# Patient Record
Sex: Female | Born: 1935 | Race: White | Hispanic: No | Marital: Married | Smoking: Former smoker
Health system: Southern US, Community
[De-identification: ages and names within clinical notes are randomized; demographics above are authoritative.]

## PROBLEM LIST (undated history)

## (undated) DIAGNOSIS — I4891 Unspecified atrial fibrillation: Secondary | ICD-10-CM

## (undated) DIAGNOSIS — R32 Unspecified urinary incontinence: Secondary | ICD-10-CM

## (undated) DIAGNOSIS — N200 Calculus of kidney: Secondary | ICD-10-CM

## (undated) DIAGNOSIS — I2699 Other pulmonary embolism without acute cor pulmonale: Secondary | ICD-10-CM

## (undated) DIAGNOSIS — I82409 Acute embolism and thrombosis of unspecified deep veins of unspecified lower extremity: Secondary | ICD-10-CM

## (undated) DIAGNOSIS — G629 Polyneuropathy, unspecified: Secondary | ICD-10-CM

## (undated) DIAGNOSIS — N390 Urinary tract infection, site not specified: Secondary | ICD-10-CM

## (undated) DIAGNOSIS — T7840XA Allergy, unspecified, initial encounter: Secondary | ICD-10-CM

## (undated) DIAGNOSIS — Z86718 Personal history of other venous thrombosis and embolism: Secondary | ICD-10-CM

## (undated) DIAGNOSIS — E119 Type 2 diabetes mellitus without complications: Secondary | ICD-10-CM

## (undated) DIAGNOSIS — M1990 Unspecified osteoarthritis, unspecified site: Secondary | ICD-10-CM

## (undated) DIAGNOSIS — R3129 Other microscopic hematuria: Secondary | ICD-10-CM

## (undated) DIAGNOSIS — M810 Age-related osteoporosis without current pathological fracture: Secondary | ICD-10-CM

## (undated) DIAGNOSIS — IMO0002 Reserved for concepts with insufficient information to code with codable children: Secondary | ICD-10-CM

## (undated) DIAGNOSIS — N6019 Diffuse cystic mastopathy of unspecified breast: Secondary | ICD-10-CM

## (undated) DIAGNOSIS — N952 Postmenopausal atrophic vaginitis: Secondary | ICD-10-CM

## (undated) DIAGNOSIS — E785 Hyperlipidemia, unspecified: Secondary | ICD-10-CM

## (undated) DIAGNOSIS — N3020 Other chronic cystitis without hematuria: Secondary | ICD-10-CM

## (undated) DIAGNOSIS — I10 Essential (primary) hypertension: Secondary | ICD-10-CM

## (undated) HISTORY — DX: Urinary tract infection, site not specified: N39.0

## (undated) HISTORY — DX: Unspecified atrial fibrillation: I48.91

## (undated) HISTORY — PX: OTHER SURGICAL HISTORY: SHX169

## (undated) HISTORY — DX: Unspecified osteoarthritis, unspecified site: M19.90

## (undated) HISTORY — DX: Unspecified urinary incontinence: R32

## (undated) HISTORY — PX: KNEE ARTHROSCOPY: SHX127

## (undated) HISTORY — DX: Polyneuropathy, unspecified: G62.9

## (undated) HISTORY — DX: Other chronic cystitis without hematuria: N30.20

## (undated) HISTORY — DX: Diffuse cystic mastopathy of unspecified breast: N60.19

## (undated) HISTORY — DX: Reserved for concepts with insufficient information to code with codable children: IMO0002

## (undated) HISTORY — DX: Age-related osteoporosis without current pathological fracture: M81.0

## (undated) HISTORY — DX: Essential (primary) hypertension: I10

## (undated) HISTORY — PX: SPLENECTOMY, TOTAL: SHX788

## (undated) HISTORY — PX: ABDOMINAL HYSTERECTOMY: SHX81

## (undated) HISTORY — DX: Allergy, unspecified, initial encounter: T78.40XA

## (undated) HISTORY — DX: Hyperlipidemia, unspecified: E78.5

## (undated) HISTORY — DX: Other pulmonary embolism without acute cor pulmonale: I26.99

## (undated) HISTORY — DX: Other microscopic hematuria: R31.29

## (undated) HISTORY — DX: Calculus of kidney: N20.0

## (undated) HISTORY — PX: HERNIA REPAIR: SHX51

## (undated) HISTORY — DX: Type 2 diabetes mellitus without complications: E11.9

## (undated) HISTORY — DX: Postmenopausal atrophic vaginitis: N95.2

## (undated) HISTORY — PX: BREAST SURGERY: SHX581

## (undated) HISTORY — PX: TONSILLECTOMY: SUR1361

## (undated) HISTORY — DX: Personal history of other venous thrombosis and embolism: Z86.718

## (undated) HISTORY — DX: Acute embolism and thrombosis of unspecified deep veins of unspecified lower extremity: I82.409

---

## 1990-04-02 DIAGNOSIS — I10 Essential (primary) hypertension: Secondary | ICD-10-CM

## 1990-04-02 HISTORY — DX: Essential (primary) hypertension: I10

## 2004-06-01 ENCOUNTER — Ambulatory Visit: Payer: Self-pay | Admitting: Internal Medicine

## 2005-07-04 ENCOUNTER — Ambulatory Visit: Payer: Self-pay | Admitting: Internal Medicine

## 2005-10-18 ENCOUNTER — Ambulatory Visit: Payer: Self-pay

## 2006-01-01 ENCOUNTER — Ambulatory Visit: Payer: Self-pay | Admitting: Anesthesiology

## 2006-02-04 ENCOUNTER — Ambulatory Visit: Payer: Self-pay | Admitting: Anesthesiology

## 2006-03-06 ENCOUNTER — Ambulatory Visit: Payer: Self-pay | Admitting: Anesthesiology

## 2006-04-02 DIAGNOSIS — I2699 Other pulmonary embolism without acute cor pulmonale: Secondary | ICD-10-CM

## 2006-04-02 HISTORY — DX: Other pulmonary embolism without acute cor pulmonale: I26.99

## 2006-05-23 ENCOUNTER — Ambulatory Visit: Payer: Self-pay | Admitting: Anesthesiology

## 2006-07-13 ENCOUNTER — Inpatient Hospital Stay: Payer: Self-pay | Admitting: Internal Medicine

## 2006-07-13 ENCOUNTER — Other Ambulatory Visit: Payer: Self-pay

## 2006-08-07 ENCOUNTER — Emergency Department: Payer: Self-pay | Admitting: Emergency Medicine

## 2006-09-19 ENCOUNTER — Ambulatory Visit: Payer: Self-pay | Admitting: Internal Medicine

## 2006-10-17 ENCOUNTER — Ambulatory Visit: Payer: Self-pay | Admitting: Urology

## 2006-10-30 ENCOUNTER — Ambulatory Visit: Payer: Self-pay | Admitting: Urology

## 2007-04-21 ENCOUNTER — Ambulatory Visit: Payer: Self-pay | Admitting: Urology

## 2007-09-23 ENCOUNTER — Ambulatory Visit: Payer: Self-pay | Admitting: Internal Medicine

## 2008-04-27 ENCOUNTER — Ambulatory Visit: Payer: Self-pay | Admitting: Urology

## 2008-07-10 ENCOUNTER — Ambulatory Visit: Payer: Self-pay | Admitting: Internal Medicine

## 2008-09-27 ENCOUNTER — Ambulatory Visit: Payer: Self-pay | Admitting: Internal Medicine

## 2009-02-14 ENCOUNTER — Ambulatory Visit: Payer: Self-pay

## 2009-04-02 HISTORY — PX: COLONOSCOPY: SHX174

## 2009-04-02 HISTORY — PX: POLYPECTOMY: SHX149

## 2009-04-21 ENCOUNTER — Ambulatory Visit: Payer: Self-pay | Admitting: Urology

## 2009-04-23 ENCOUNTER — Emergency Department: Payer: Self-pay | Admitting: Emergency Medicine

## 2009-05-03 ENCOUNTER — Ambulatory Visit: Payer: Self-pay | Admitting: Urology

## 2009-07-19 ENCOUNTER — Ambulatory Visit: Payer: Self-pay | Admitting: Internal Medicine

## 2009-07-29 ENCOUNTER — Inpatient Hospital Stay: Payer: Self-pay | Admitting: Internal Medicine

## 2009-08-02 ENCOUNTER — Encounter: Payer: Self-pay | Admitting: Internal Medicine

## 2009-08-31 ENCOUNTER — Encounter: Payer: Self-pay | Admitting: Internal Medicine

## 2009-09-30 ENCOUNTER — Encounter: Payer: Self-pay | Admitting: Internal Medicine

## 2009-12-19 ENCOUNTER — Ambulatory Visit: Payer: Self-pay | Admitting: General Practice

## 2009-12-30 ENCOUNTER — Ambulatory Visit: Payer: Self-pay | Admitting: Cardiovascular Disease

## 2010-01-02 ENCOUNTER — Ambulatory Visit: Payer: Self-pay | Admitting: Vascular Surgery

## 2010-01-04 ENCOUNTER — Inpatient Hospital Stay: Payer: Self-pay | Admitting: General Practice

## 2010-07-19 ENCOUNTER — Other Ambulatory Visit: Payer: Self-pay | Admitting: Internal Medicine

## 2010-09-06 ENCOUNTER — Ambulatory Visit: Payer: Self-pay | Admitting: Vascular Surgery

## 2010-11-22 ENCOUNTER — Ambulatory Visit: Payer: Self-pay | Admitting: Urology

## 2011-03-03 DIAGNOSIS — I82409 Acute embolism and thrombosis of unspecified deep veins of unspecified lower extremity: Secondary | ICD-10-CM

## 2011-03-03 HISTORY — DX: Acute embolism and thrombosis of unspecified deep veins of unspecified lower extremity: I82.409

## 2011-03-14 ENCOUNTER — Inpatient Hospital Stay: Payer: Self-pay | Admitting: Vascular Surgery

## 2012-08-20 ENCOUNTER — Inpatient Hospital Stay: Payer: Self-pay | Admitting: General Surgery

## 2012-08-20 DIAGNOSIS — K430 Incisional hernia with obstruction, without gangrene: Secondary | ICD-10-CM

## 2012-08-20 LAB — COMPREHENSIVE METABOLIC PANEL
Alkaline Phosphatase: 83 U/L (ref 50–136)
BUN: 21 mg/dL — ABNORMAL HIGH (ref 7–18)
Bilirubin,Total: 0.5 mg/dL (ref 0.2–1.0)
Chloride: 101 mmol/L (ref 98–107)
Creatinine: 1.37 mg/dL — ABNORMAL HIGH (ref 0.60–1.30)
Osmolality: 286 (ref 275–301)
Sodium: 138 mmol/L (ref 136–145)
Total Protein: 8.5 g/dL — ABNORMAL HIGH (ref 6.4–8.2)

## 2012-08-20 LAB — CBC
HCT: 42.9 % (ref 35.0–47.0)
HGB: 14.6 g/dL (ref 12.0–16.0)
MCH: 33 pg (ref 26.0–34.0)
MCHC: 33.9 g/dL (ref 32.0–36.0)
Platelet: 314 10*3/uL (ref 150–440)
RDW: 13.8 % (ref 11.5–14.5)
WBC: 9.7 10*3/uL (ref 3.6–11.0)

## 2012-08-20 LAB — MAGNESIUM: Magnesium: 2.1 mg/dL

## 2012-08-20 LAB — URINALYSIS, COMPLETE
Bacteria: NONE SEEN
Leukocyte Esterase: NEGATIVE
Nitrite: NEGATIVE
Squamous Epithelial: 1

## 2012-08-20 LAB — PROTIME-INR
INR: 2.4
Prothrombin Time: 25.4 secs — ABNORMAL HIGH (ref 11.5–14.7)

## 2012-08-20 LAB — TROPONIN I: Troponin-I: <0.02 ng/mL

## 2012-08-21 ENCOUNTER — Encounter: Payer: Self-pay | Admitting: General Surgery

## 2012-08-21 LAB — CBC WITH DIFFERENTIAL/PLATELET
Basophil #: 0.1 10*3/uL (ref 0.0–0.1)
Basophil %: 0.8 %
Eosinophil #: 0.1 10*3/uL (ref 0.0–0.7)
Eosinophil %: 2.3 %
HGB: 12.9 g/dL (ref 12.0–16.0)
Lymphocyte #: 1.9 10*3/uL (ref 1.0–3.6)
MCH: 32.9 pg (ref 26.0–34.0)
MCHC: 33.5 g/dL (ref 32.0–36.0)
MCV: 98 fL (ref 80–100)
Monocyte #: 0.6 x10 3/mm (ref 0.2–0.9)
Neutrophil %: 56.4 %
WBC: 6.3 10*3/uL (ref 3.6–11.0)

## 2012-08-21 LAB — BASIC METABOLIC PANEL
BUN: 15 mg/dL (ref 7–18)
Calcium, Total: 8.6 mg/dL (ref 8.5–10.1)
Chloride: 111 mmol/L — ABNORMAL HIGH (ref 98–107)
Co2: 32 mmol/L (ref 21–32)
Creatinine: 0.98 mg/dL (ref 0.60–1.30)
Potassium: 5.2 mmol/L — ABNORMAL HIGH (ref 3.5–5.1)
Sodium: 145 mmol/L (ref 136–145)

## 2012-08-21 LAB — PROTIME-INR
INR: 2
Prothrombin Time: 22.3 secs — ABNORMAL HIGH (ref 11.5–14.7)

## 2012-08-22 LAB — BASIC METABOLIC PANEL
Anion Gap: 4 — ABNORMAL LOW (ref 7–16)
BUN: 12 mg/dL (ref 7–18)
Calcium, Total: 9.1 mg/dL (ref 8.5–10.1)
Chloride: 111 mmol/L — ABNORMAL HIGH (ref 98–107)
Creatinine: 0.87 mg/dL (ref 0.60–1.30)
EGFR (African American): >60
Osmolality: 279 (ref 275–301)
Potassium: 5.1 mmol/L (ref 3.5–5.1)

## 2012-08-22 LAB — PROTIME-INR
INR: 1.5
Prothrombin Time: 17.8 secs — ABNORMAL HIGH (ref 11.5–14.7)

## 2012-08-23 LAB — BASIC METABOLIC PANEL
Anion Gap: 4 — ABNORMAL LOW (ref 7–16)
Calcium, Total: 9.3 mg/dL (ref 8.5–10.1)
Chloride: 107 mmol/L (ref 98–107)
EGFR (African American): >60
EGFR (Non-African Amer.): 53 — ABNORMAL LOW
Glucose: 94 mg/dL (ref 65–99)
Osmolality: 278 (ref 275–301)
Potassium: 4.4 mmol/L (ref 3.5–5.1)

## 2012-08-23 LAB — PROTIME-INR
INR: 1.2
Prothrombin Time: 15.5 secs — ABNORMAL HIGH (ref 11.5–14.7)

## 2012-08-24 LAB — PROTIME-INR: INR: 1.1

## 2012-08-25 LAB — PLATELET COUNT: Platelet: 299 10*3/uL (ref 150–440)

## 2012-08-25 LAB — PROTIME-INR: Prothrombin Time: 13.8 secs (ref 11.5–14.7)

## 2012-08-26 LAB — CREATININE, SERUM
Creatinine: 0.94 mg/dL (ref 0.60–1.30)
EGFR (African American): >60
EGFR (Non-African Amer.): 54 — ABNORMAL LOW

## 2012-08-27 ENCOUNTER — Encounter: Payer: Self-pay | Admitting: General Surgery

## 2012-08-28 ENCOUNTER — Ambulatory Visit (INDEPENDENT_AMBULATORY_CARE_PROVIDER_SITE_OTHER): Payer: Medicare Other | Admitting: General Surgery

## 2012-08-28 ENCOUNTER — Other Ambulatory Visit: Payer: Self-pay | Admitting: General Surgery

## 2012-08-28 ENCOUNTER — Encounter: Payer: Self-pay | Admitting: General Surgery

## 2012-08-28 VITALS — BP 120/78 | HR 78 | Resp 16 | Ht 61.0 in | Wt 150.0 lb

## 2012-08-28 DIAGNOSIS — K432 Incisional hernia without obstruction or gangrene: Secondary | ICD-10-CM

## 2012-08-28 NOTE — Progress Notes (Signed)
Patient ID: Crystal Goodwin, female   DOB: 01/20/1922, 79 y.o.   MRN: [id number]  Chief Complaint  Patient presents with  . Pre-op Exam    hernia repair    HPI Crystal Goodwin is a 79 y.o. female Patient here today for preop incisional hernia surgery. About 10 days ago the patient presented to the hospital with complained of significant abdominal pain nausea and vomiting. A CT scan showed evidence of low midline abdominal wall hernia with a transition zone in the small bowel suggesting partial obstruction. Patient was admitted and kept n.p.o. for 48 hours and showed good response with a return of bowel activity and full resolution of her pain nausea and vomiting. She is currently on a semisolid diet and appears to be doing well tentative plan for repair of this hernia and laparoscopic or open as deemed feasible at time of surgery. Surgery is scheduled for next week. HPI  Past Medical History  Diagnosis Date  . Diabetes mellitus without complication   . Neuropathy   . Atrial fibrillation   . Chronic UTI   . Urine incontinence   . H/O blood clots   . Arthritis     Past Surgical History  Procedure Laterality Date  . Abdominal hysterectomy    . Splenectomy, total    . Breast surgery      biopsy    History reviewed. No pertinent family history.  Social History History  Substance Use Topics  . Smoking status: Not on file  . Smokeless tobacco: Not on file  . Alcohol Use: Not on file    Allergies  Allergen Reactions  . Macrodantin (Nitrofurantoin Macrocrystal)   . Penicillins   . Sulfa Antibiotics Other (See Comments)    Per patient "skin breaks out"    Current Outpatient Prescriptions  Medication Sig Dispense Refill  . calcium citrate-vitamin D (CITRACAL+D) 315-200 MG-UNIT per tablet Take 1 tablet by mouth daily.      Marland Kitchen diltiazem (DILACOR XR) 180 MG 24 hr capsule Take 180 mg by mouth daily.      Marland Kitchen enoxaparin (LOVENOX) 80 MG/0.8ML injection Inject 70 mg into the  skin daily.      Marland Kitchen gabapentin (NEURONTIN) 800 MG tablet Take 800 mg by mouth daily.      Marland Kitchen glimepiride (AMARYL) 2 MG tablet Take 2 mg by mouth daily before breakfast.      . hydrochlorothiazide (HYDRODIURIL) 25 MG tablet Take 25 mg by mouth daily.      . Multiple Vitamin (MULTIVITAMIN) tablet Take 1 tablet by mouth daily.      . Vilazodone HCl (VIIBRYD) 40 MG TABS Take 40 mg by mouth daily.       No current facility-administered medications for this visit.    Review of Systems Review of Systems  Constitutional: Negative.   Respiratory: Negative.   Cardiovascular: Negative.     Blood pressure 120/78, pulse 78, resp. rate 16, height 5\' 1"  (1.549 m), weight 150 lb (68.04 kg).  Physical Exam Physical Exam  Constitutional: She is oriented to person, place, and time. She appears well-developed and well-nourished.  Cardiovascular: Normal rate and regular rhythm.   Pulmonary/Chest: Effort normal.  Abdominal: Soft. Bowel sounds are normal. She exhibits distension. There is no tenderness.  No definite hernia is palpable but CT shows this to be in the suprapubic area.  Neurological: She is alert and oriented to person, place, and time.  Skin: Skin is warm.  mild distention of abdomen  Data Reviewed CT  scan reviewed.  Assessment    Incisional hernia with bowel involvement.      Plan    Repair discussed fully with pt and her daughter.        Merelin Human G 08/28/2012, 10:49 AM

## 2012-08-28 NOTE — Patient Instructions (Addendum)
Resume activities as tolerated. The patient is aware to call back for any questions or concerns. Half dose Lovenox on Sunday and none on Monday

## 2012-09-01 ENCOUNTER — Ambulatory Visit: Payer: Self-pay | Admitting: General Surgery

## 2012-09-01 DIAGNOSIS — K432 Incisional hernia without obstruction or gangrene: Secondary | ICD-10-CM

## 2012-09-02 ENCOUNTER — Encounter: Payer: Self-pay | Admitting: *Deleted

## 2012-09-02 ENCOUNTER — Encounter: Payer: Self-pay | Admitting: General Surgery

## 2012-09-02 LAB — CBC WITH DIFFERENTIAL/PLATELET
Basophil %: 0.7 %
HCT: 38.9 % (ref 35.0–47.0)
Lymphocyte #: 1.1 10*3/uL (ref 1.0–3.6)
Lymphocyte %: 13.9 %
MCH: 32.9 pg (ref 26.0–34.0)
Monocyte #: 0.6 x10 3/mm (ref 0.2–0.9)
Monocyte %: 7 %
Neutrophil #: 6.1 10*3/uL (ref 1.4–6.5)
WBC: 8 10*3/uL (ref 3.6–11.0)

## 2012-09-02 LAB — BASIC METABOLIC PANEL
Anion Gap: 4 — ABNORMAL LOW (ref 7–16)
Chloride: 108 mmol/L — ABNORMAL HIGH (ref 98–107)
Co2: 29 mmol/L (ref 21–32)
Creatinine: 1.15 mg/dL (ref 0.60–1.30)
EGFR (African American): 49 — ABNORMAL LOW
EGFR (Non-African Amer.): 42 — ABNORMAL LOW
Osmolality: 281 (ref 275–301)
Sodium: 141 mmol/L (ref 136–145)

## 2012-09-02 NOTE — Telephone Encounter (Signed)
This encounter was created in error - please disregard.

## 2012-09-05 ENCOUNTER — Encounter: Payer: Self-pay | Admitting: General Surgery

## 2012-09-05 ENCOUNTER — Ambulatory Visit (INDEPENDENT_AMBULATORY_CARE_PROVIDER_SITE_OTHER): Payer: Medicare Other | Admitting: General Surgery

## 2012-09-05 VITALS — BP 120/64 | HR 68 | Resp 14 | Ht 61.0 in | Wt 153.0 lb

## 2012-09-05 DIAGNOSIS — K432 Incisional hernia without obstruction or gangrene: Secondary | ICD-10-CM

## 2012-09-05 NOTE — Progress Notes (Signed)
Patient ID: Shakia Sebastiano, female   DOB: [birth year], 79 y.o.   MRN: [id number]  No chief complaint on file.   HPI Raychell Holcomb is a 79 y.o. female here today for bleeding from incision of incisional  hernia repair done on 09/02/12. She went to the bathroom and noted bloody drainage from her incision. Otherwise doing well. HPI  Past Medical History  Diagnosis Date  . Diabetes mellitus without complication   . Neuropathy   . Atrial fibrillation   . Chronic UTI   . Urine incontinence   . H/O blood clots   . Arthritis   . Allergy   . Hypertension 1992    Past Surgical History  Procedure Laterality Date  . Abdominal hysterectomy    . Splenectomy, total    . Breast surgery      biopsy  . Colonoscopy  2011  . Polypectomy  2011  . Tonsillectomy      History reviewed. No pertinent family history.  Social History History  Substance Use Topics  . Smoking status: Former Smoker -- 1.00 packs/day for 10 years  . Smokeless tobacco: Never Used  . Alcohol Use: Yes    Allergies  Allergen Reactions  . Macrodantin (Nitrofurantoin Macrocrystal)   . Penicillins   . Sulfa Antibiotics Other (See Comments)    Per patient "skin breaks out"    Current Outpatient Prescriptions  Medication Sig Dispense Refill  . ALPRAZolam (XANAX) 0.5 MG tablet Take 0.5 mg by mouth daily.      . calcium citrate-vitamin D (CITRACAL+D) 315-200 MG-UNIT per tablet Take 1 tablet by mouth daily.      Marland Kitchen diltiazem (DILACOR XR) 180 MG 24 hr capsule Take 180 mg by mouth daily.      Marland Kitchen enoxaparin (LOVENOX) 80 MG/0.8ML injection Inject 70 mg into the skin daily.      Marland Kitchen gabapentin (NEURONTIN) 800 MG tablet Take 800 mg by mouth daily.      Marland Kitchen glimepiride (AMARYL) 2 MG tablet Take 2 mg by mouth daily before breakfast.      . hydrochlorothiazide (HYDRODIURIL) 25 MG tablet Take 25 mg by mouth daily.      . Multiple Vitamin (MULTIVITAMIN) tablet Take 1 tablet by mouth daily.      . Vilazodone HCl (VIIBRYD) 40 MG  TABS Take 40 mg by mouth daily.       No current facility-administered medications for this visit.    Review of Systems Review of Systems  Constitutional: Negative.   Respiratory: Negative.   Cardiovascular: Negative.     Blood pressure 120/64, pulse 68, resp. rate 14, height 5\' 1"  (1.549 m), weight 153 lb (69.4 kg).  Physical Exam Physical ExamIncision in lower mid abdomen is intact. Minimal spotting of old blood/serroma fluid noted. No swelling or redness associated. Abd is otherwise soft, nontender. Data Reviewed    Assessment    Likely seroma that drained.     Plan    Dry dressing as needed. RTC on 09/08/12 as scheduled.        SANKAR,SEEPLAPUTHUR G 09/05/2012, 4:44 PM

## 2012-09-05 NOTE — Patient Instructions (Addendum)
Return on Monday  

## 2012-09-08 ENCOUNTER — Encounter: Payer: Self-pay | Admitting: General Surgery

## 2012-09-08 ENCOUNTER — Ambulatory Visit (INDEPENDENT_AMBULATORY_CARE_PROVIDER_SITE_OTHER): Payer: Medicare Other | Admitting: General Surgery

## 2012-09-08 VITALS — BP 126/70 | HR 76 | Resp 16 | Ht 61.0 in | Wt 153.0 lb

## 2012-09-08 DIAGNOSIS — Z7901 Long term (current) use of anticoagulants: Secondary | ICD-10-CM

## 2012-09-08 DIAGNOSIS — Z5181 Encounter for therapeutic drug level monitoring: Secondary | ICD-10-CM

## 2012-09-08 DIAGNOSIS — K432 Incisional hernia without obstruction or gangrene: Secondary | ICD-10-CM

## 2012-09-08 NOTE — Patient Instructions (Addendum)
Patient to return in four weeks.  Patient to have labs drawn today.

## 2012-09-08 NOTE — Progress Notes (Signed)
Patient ID: Crystal Goodwin, female   DOB: [birth year], 79 y.o.   MRN: [id number] This is a 79 year old female here for her post op hernia repair done on 09/01/12. No complaints. No drainage noted since last week Friday Incision is clean  Removed staples applied stir stirps. F/U in 4 weeks.  Patient to have the following drawn at [facility] lab today: protime.

## 2012-09-09 ENCOUNTER — Telehealth: Payer: Self-pay | Admitting: *Deleted

## 2012-09-09 LAB — PROTIME-INR
INR: 1.2 (ref 0.8–1.2)
Prothrombin Time: 12.7 s — ABNORMAL HIGH (ref 9.1–12.0)

## 2012-09-09 NOTE — Telephone Encounter (Signed)
Message copied by Nicholes Mango on Tue Sep 09, 2012  8:59 AM ------      Message from: Kieth Brightly      Created: Tue Sep 09, 2012  8:25 AM       Pt needs to check with Dr. Jasper Loser office for further coumadin dosing and Protimes. ------

## 2012-09-09 NOTE — Telephone Encounter (Signed)
Robin at Dr. Jasper Loser office was contacted as instructed. She will forward the message to Dr. Jasper Loser nurse and they will contact the patient for further follow up. Lab results faxed as well.

## 2012-10-06 ENCOUNTER — Ambulatory Visit (INDEPENDENT_AMBULATORY_CARE_PROVIDER_SITE_OTHER): Payer: Medicare Other | Admitting: General Surgery

## 2012-10-06 ENCOUNTER — Encounter: Payer: Self-pay | Admitting: General Surgery

## 2012-10-06 VITALS — BP 130/72 | HR 68 | Resp 13 | Ht 61.0 in | Wt 151.0 lb

## 2012-10-06 DIAGNOSIS — K432 Incisional hernia without obstruction or gangrene: Secondary | ICD-10-CM

## 2012-10-06 NOTE — Progress Notes (Signed)
Patient ID: Crystal Goodwin, female   DOB: [birth year], 80 y.o.   MRN: [id number] Patient here for 6 week follow up for incisional hernia repair. Patient last here 09-08-12. Patient reports no discomfort and has resumed normal daily activities.   Exam shows abdominal incision lower part to be well healed. No hernia detected. Abdomen is soft and non tender.

## 2012-10-06 NOTE — Patient Instructions (Addendum)
Return if needed. No activity restrictions.

## 2013-09-08 ENCOUNTER — Ambulatory Visit: Payer: Self-pay | Admitting: Urology

## 2013-11-17 ENCOUNTER — Ambulatory Visit: Payer: Self-pay | Admitting: Urology

## 2013-11-20 ENCOUNTER — Emergency Department: Payer: Self-pay | Admitting: Emergency Medicine

## 2013-11-20 LAB — COMPREHENSIVE METABOLIC PANEL
AST: 36 U/L (ref 15–37)
Albumin: 3 g/dL — ABNORMAL LOW (ref 3.4–5.0)
Alkaline Phosphatase: 65 U/L
Anion Gap: 8 (ref 7–16)
BUN: 17 mg/dL (ref 7–18)
Bilirubin,Total: 0.7 mg/dL (ref 0.2–1.0)
CALCIUM: 9.6 mg/dL (ref 8.5–10.1)
CHLORIDE: 102 mmol/L (ref 98–107)
CO2: 31 mmol/L (ref 21–32)
Creatinine: 1.36 mg/dL — ABNORMAL HIGH (ref 0.60–1.30)
EGFR (African American): 40 — ABNORMAL LOW
GFR CALC NON AF AMER: 34 — AB
GLUCOSE: 228 mg/dL — AB (ref 65–99)
Osmolality: 290 (ref 275–301)
POTASSIUM: 4.3 mmol/L (ref 3.5–5.1)
SGPT (ALT): 22 U/L
Sodium: 141 mmol/L (ref 136–145)
Total Protein: 7.5 g/dL (ref 6.4–8.2)

## 2013-11-20 LAB — URINALYSIS, COMPLETE
BILIRUBIN, UR: NEGATIVE
Bacteria: NONE SEEN
Glucose,UR: 50 mg/dL (ref 0–75)
KETONE: NEGATIVE
LEUKOCYTE ESTERASE: NEGATIVE
Nitrite: NEGATIVE
PROTEIN: NEGATIVE
Ph: 7 (ref 4.5–8.0)
Specific Gravity: 1.01 (ref 1.003–1.030)
WBC UR: 3 /HPF (ref 0–5)

## 2013-11-20 LAB — CBC WITH DIFFERENTIAL/PLATELET
Comment - H1-Com1: NORMAL
HCT: 42.7 % (ref 35.0–47.0)
HGB: 13.9 g/dL (ref 12.0–16.0)
LYMPHS PCT: 13 %
MCH: 31.9 pg (ref 26.0–34.0)
MCHC: 32.5 g/dL (ref 32.0–36.0)
MCV: 98 fL (ref 80–100)
MONOS PCT: 18 %
OTHER CELLS BLOOD: 1
PLATELETS: 310 10*3/uL (ref 150–440)
RBC: 4.35 10*6/uL (ref 3.80–5.20)
RDW: 14.8 % — ABNORMAL HIGH (ref 11.5–14.5)
SEGMENTED NEUTROPHILS: 68 %
WBC: 11 10*3/uL (ref 3.6–11.0)

## 2013-11-20 LAB — TROPONIN I: Troponin-I: <0.02 ng/mL

## 2013-11-21 ENCOUNTER — Inpatient Hospital Stay: Payer: Self-pay | Admitting: Internal Medicine

## 2013-11-21 LAB — COMPREHENSIVE METABOLIC PANEL
ALBUMIN: 3 g/dL — AB (ref 3.4–5.0)
ALT: 22 U/L
AST: 43 U/L — AB (ref 15–37)
Alkaline Phosphatase: 61 U/L
Anion Gap: 9 (ref 7–16)
BILIRUBIN TOTAL: 0.6 mg/dL (ref 0.2–1.0)
BUN: 17 mg/dL (ref 7–18)
CHLORIDE: 102 mmol/L (ref 98–107)
Calcium, Total: 9.6 mg/dL (ref 8.5–10.1)
Co2: 30 mmol/L (ref 21–32)
Creatinine: 1.39 mg/dL — ABNORMAL HIGH (ref 0.60–1.30)
EGFR (Non-African Amer.): 34 — ABNORMAL LOW
GFR CALC AF AMER: 39 — AB
GLUCOSE: 127 mg/dL — AB (ref 65–99)
OSMOLALITY: 284 (ref 275–301)
Potassium: 4.3 mmol/L (ref 3.5–5.1)
SODIUM: 141 mmol/L (ref 136–145)
Total Protein: 7.4 g/dL (ref 6.4–8.2)

## 2013-11-21 LAB — CBC
HCT: 42.8 % (ref 35.0–47.0)
HGB: 13.7 g/dL (ref 12.0–16.0)
MCH: 31.7 pg (ref 26.0–34.0)
MCHC: 32.1 g/dL (ref 32.0–36.0)
MCV: 99 fL (ref 80–100)
PLATELETS: 243 10*3/uL (ref 150–440)
RBC: 4.33 10*6/uL (ref 3.80–5.20)
RDW: 14.9 % — AB (ref 11.5–14.5)
WBC: 11.1 10*3/uL — AB (ref 3.6–11.0)

## 2013-11-21 LAB — CK-MB: CK-MB: <0.5 ng/mL — ABNORMAL LOW (ref 0.5–3.6)

## 2013-11-21 LAB — PRO B NATRIURETIC PEPTIDE: B-TYPE NATIURETIC PEPTID: 1549 pg/mL — AB (ref 0–450)

## 2013-11-21 LAB — TROPONIN I: Troponin-I: <0.02 ng/mL

## 2013-11-22 LAB — CBC WITH DIFFERENTIAL/PLATELET
Basophil #: 0 10*3/uL (ref 0.0–0.1)
Basophil %: 0.3 %
EOS ABS: 0 10*3/uL (ref 0.0–0.7)
EOS PCT: 0 %
HCT: 39 % (ref 35.0–47.0)
HGB: 12.8 g/dL (ref 12.0–16.0)
LYMPHS ABS: 1.3 10*3/uL (ref 1.0–3.6)
Lymphocyte %: 13.9 %
MCH: 32.4 pg (ref 26.0–34.0)
MCHC: 32.8 g/dL (ref 32.0–36.0)
MCV: 99 fL (ref 80–100)
Monocyte #: 1.3 x10 3/mm — ABNORMAL HIGH (ref 0.2–0.9)
Monocyte %: 13.8 %
Neutrophil #: 6.7 10*3/uL — ABNORMAL HIGH (ref 1.4–6.5)
Neutrophil %: 72 %
Platelet: 218 10*3/uL (ref 150–440)
RBC: 3.94 10*6/uL (ref 3.80–5.20)
RDW: 15.5 % — ABNORMAL HIGH (ref 11.5–14.5)
WBC: 9.3 10*3/uL (ref 3.6–11.0)

## 2013-11-22 LAB — BASIC METABOLIC PANEL
ANION GAP: 14 (ref 7–16)
BUN: 17 mg/dL (ref 7–18)
Calcium, Total: 8.5 mg/dL (ref 8.5–10.1)
Chloride: 100 mmol/L (ref 98–107)
Co2: 24 mmol/L (ref 21–32)
Creatinine: 1.2 mg/dL (ref 0.60–1.30)
EGFR (African American): 46 — ABNORMAL LOW
EGFR (Non-African Amer.): 40 — ABNORMAL LOW
Glucose: 260 mg/dL — ABNORMAL HIGH (ref 65–99)
OSMOLALITY: 286 (ref 275–301)
Potassium: 4.3 mmol/L (ref 3.5–5.1)
Sodium: 138 mmol/L (ref 136–145)

## 2013-11-22 LAB — CK-MB: CK-MB: 0.7 ng/mL (ref 0.5–3.6)

## 2013-11-22 LAB — URINE CULTURE

## 2013-11-22 LAB — TROPONIN I

## 2013-11-23 LAB — BASIC METABOLIC PANEL
Anion Gap: 9 (ref 7–16)
BUN: 22 mg/dL — AB (ref 7–18)
CHLORIDE: 106 mmol/L (ref 98–107)
CREATININE: 1.07 mg/dL (ref 0.60–1.30)
Calcium, Total: 8.4 mg/dL — ABNORMAL LOW (ref 8.5–10.1)
Co2: 28 mmol/L (ref 21–32)
EGFR (African American): 53 — ABNORMAL LOW
GFR CALC NON AF AMER: 46 — AB
GLUCOSE: 126 mg/dL — AB (ref 65–99)
Osmolality: 290 (ref 275–301)
Potassium: 3.5 mmol/L (ref 3.5–5.1)
Sodium: 143 mmol/L (ref 136–145)

## 2013-11-23 LAB — CBC WITH DIFFERENTIAL/PLATELET
Basophil #: 0 10*3/uL (ref 0.0–0.1)
Basophil %: 0.3 %
EOS ABS: 0 10*3/uL (ref 0.0–0.7)
EOS PCT: 0 %
HCT: 37.8 % (ref 35.0–47.0)
HGB: 12.1 g/dL (ref 12.0–16.0)
Lymphocyte #: 1.1 10*3/uL (ref 1.0–3.6)
Lymphocyte %: 7.3 %
MCH: 31.9 pg (ref 26.0–34.0)
MCHC: 32.1 g/dL (ref 32.0–36.0)
MCV: 99 fL (ref 80–100)
MONOS PCT: 17.8 %
Monocyte #: 2.7 x10 3/mm — ABNORMAL HIGH (ref 0.2–0.9)
NEUTROS PCT: 74.6 %
Neutrophil #: 11.5 10*3/uL — ABNORMAL HIGH (ref 1.4–6.5)
Platelet: 222 10*3/uL (ref 150–440)
RBC: 3.8 10*6/uL (ref 3.80–5.20)
RDW: 15.6 % — ABNORMAL HIGH (ref 11.5–14.5)
WBC: 15.4 10*3/uL — ABNORMAL HIGH (ref 3.6–11.0)

## 2013-11-24 LAB — CBC WITH DIFFERENTIAL/PLATELET
BASOS PCT: 0.5 %
Basophil #: 0.1 10*3/uL (ref 0.0–0.1)
Eosinophil #: 0 10*3/uL (ref 0.0–0.7)
Eosinophil %: 0.1 %
HCT: 40.1 % (ref 35.0–47.0)
HGB: 12.9 g/dL (ref 12.0–16.0)
LYMPHS PCT: 13.6 %
Lymphocyte #: 1.9 10*3/uL (ref 1.0–3.6)
MCH: 31.8 pg (ref 26.0–34.0)
MCHC: 32.1 g/dL (ref 32.0–36.0)
MCV: 99 fL (ref 80–100)
Monocyte #: 2.9 x10 3/mm — ABNORMAL HIGH (ref 0.2–0.9)
Monocyte %: 21.6 %
NEUTROS ABS: 8.8 10*3/uL — AB (ref 1.4–6.5)
Neutrophil %: 64.2 %
Platelet: 242 10*3/uL (ref 150–440)
RBC: 4.05 10*6/uL (ref 3.80–5.20)
RDW: 15.6 % — AB (ref 11.5–14.5)
WBC: 13.6 10*3/uL — ABNORMAL HIGH (ref 3.6–11.0)

## 2013-11-24 LAB — TROPONIN I: Troponin-I: <0.02 ng/mL

## 2013-11-25 ENCOUNTER — Encounter: Payer: Self-pay | Admitting: Internal Medicine

## 2013-11-25 LAB — CBC WITH DIFFERENTIAL/PLATELET
BASOS PCT: 0.1 %
Basophil #: 0 10*3/uL (ref 0.0–0.1)
EOS ABS: 0 10*3/uL (ref 0.0–0.7)
Eosinophil %: 0.1 %
HCT: 37 % (ref 35.0–47.0)
HGB: 12 g/dL (ref 12.0–16.0)
LYMPHS ABS: 0.7 10*3/uL — AB (ref 1.0–3.6)
LYMPHS PCT: 7 %
MCH: 32 pg (ref 26.0–34.0)
MCHC: 32.3 g/dL (ref 32.0–36.0)
MCV: 99 fL (ref 80–100)
MONOS PCT: 5.2 %
Monocyte #: 0.5 x10 3/mm (ref 0.2–0.9)
NEUTROS ABS: 8.2 10*3/uL — AB (ref 1.4–6.5)
Neutrophil %: 87.6 %
PLATELETS: 234 10*3/uL (ref 150–440)
RBC: 3.74 10*6/uL — ABNORMAL LOW (ref 3.80–5.20)
RDW: 15.2 % — AB (ref 11.5–14.5)
WBC: 9.4 10*3/uL (ref 3.6–11.0)

## 2013-11-25 LAB — CULTURE, BLOOD (SINGLE)

## 2013-11-26 LAB — CBC WITH DIFFERENTIAL/PLATELET
Basophil #: 0 10*3/uL (ref 0.0–0.1)
Basophil %: 0.2 %
Eosinophil #: 0 10*3/uL (ref 0.0–0.7)
Eosinophil %: 0 %
HCT: 35.7 % (ref 35.0–47.0)
HGB: 11.5 g/dL — ABNORMAL LOW (ref 12.0–16.0)
Lymphocyte #: 0.5 10*3/uL — ABNORMAL LOW (ref 1.0–3.6)
Lymphocyte %: 4.1 %
MCH: 31.9 pg (ref 26.0–34.0)
MCHC: 32.3 g/dL (ref 32.0–36.0)
MCV: 99 fL (ref 80–100)
MONOS PCT: 6.4 %
Monocyte #: 0.8 x10 3/mm (ref 0.2–0.9)
Neutrophil #: 11.3 10*3/uL — ABNORMAL HIGH (ref 1.4–6.5)
Neutrophil %: 89.3 %
Platelet: 253 10*3/uL (ref 150–440)
RBC: 3.61 10*6/uL — AB (ref 3.80–5.20)
RDW: 15.5 % — ABNORMAL HIGH (ref 11.5–14.5)
WBC: 12.6 10*3/uL — ABNORMAL HIGH (ref 3.6–11.0)

## 2013-11-26 LAB — PROTIME-INR
INR: 1.6
PROTHROMBIN TIME: 18.8 s — AB (ref 11.5–14.7)

## 2013-11-26 LAB — BASIC METABOLIC PANEL
ANION GAP: 6 — AB (ref 7–16)
BUN: 19 mg/dL — ABNORMAL HIGH (ref 7–18)
CHLORIDE: 108 mmol/L — AB (ref 98–107)
Calcium, Total: 8.8 mg/dL (ref 8.5–10.1)
Co2: 28 mmol/L (ref 21–32)
Creatinine: 0.88 mg/dL (ref 0.60–1.30)
EGFR (African American): >60
GFR CALC NON AF AMER: 58 — AB
GLUCOSE: 215 mg/dL — AB (ref 65–99)
Osmolality: 292 (ref 275–301)
Potassium: 3.5 mmol/L (ref 3.5–5.1)
Sodium: 142 mmol/L (ref 136–145)

## 2013-11-27 LAB — BASIC METABOLIC PANEL
Anion Gap: 7 (ref 7–16)
BUN: 22 mg/dL — ABNORMAL HIGH (ref 7–18)
CHLORIDE: 106 mmol/L (ref 98–107)
CO2: 30 mmol/L (ref 21–32)
CREATININE: 0.94 mg/dL (ref 0.60–1.30)
Calcium, Total: 8.7 mg/dL (ref 8.5–10.1)
GFR CALC NON AF AMER: 53 — AB
GLUCOSE: 203 mg/dL — AB (ref 65–99)
Osmolality: 294 (ref 275–301)
Potassium: 3.3 mmol/L — ABNORMAL LOW (ref 3.5–5.1)
Sodium: 143 mmol/L (ref 136–145)

## 2013-11-27 LAB — CBC WITH DIFFERENTIAL/PLATELET
BASOS ABS: 0 10*3/uL (ref 0.0–0.1)
Basophil %: 0.3 %
EOS ABS: 0 10*3/uL (ref 0.0–0.7)
Eosinophil %: 0 %
HCT: 37.3 % (ref 35.0–47.0)
HGB: 12.6 g/dL (ref 12.0–16.0)
LYMPHS ABS: 0.5 10*3/uL — AB (ref 1.0–3.6)
Lymphocyte %: 4.5 %
MCH: 33.1 pg (ref 26.0–34.0)
MCHC: 33.8 g/dL (ref 32.0–36.0)
MCV: 98 fL (ref 80–100)
MONO ABS: 0.6 x10 3/mm (ref 0.2–0.9)
Monocyte %: 5.9 %
NEUTROS PCT: 89.3 %
Neutrophil #: 9.6 10*3/uL — ABNORMAL HIGH (ref 1.4–6.5)
Platelet: 290 10*3/uL (ref 150–440)
RBC: 3.8 10*6/uL (ref 3.80–5.20)
RDW: 15.7 % — AB (ref 11.5–14.5)
WBC: 10.7 10*3/uL (ref 3.6–11.0)

## 2013-11-27 LAB — PROTIME-INR
INR: 1.6
PROTHROMBIN TIME: 18.7 s — AB (ref 11.5–14.7)

## 2013-11-30 LAB — PROTIME-INR
INR: 2.7
PROTHROMBIN TIME: 27.6 s — AB (ref 11.5–14.7)

## 2013-12-01 ENCOUNTER — Encounter: Payer: Self-pay | Admitting: Internal Medicine

## 2013-12-03 LAB — HEMOGLOBIN A1C: Hemoglobin A1C: 7.3 % — ABNORMAL HIGH (ref 4.2–6.3)

## 2013-12-08 LAB — PROTIME-INR
INR: 3.7
Prothrombin Time: 35.5 secs — ABNORMAL HIGH (ref 11.5–14.7)

## 2013-12-09 ENCOUNTER — Ambulatory Visit: Payer: Self-pay | Admitting: Gerontology

## 2013-12-10 ENCOUNTER — Emergency Department: Payer: Self-pay | Admitting: Emergency Medicine

## 2013-12-10 LAB — COMPREHENSIVE METABOLIC PANEL
ALBUMIN: 2.1 g/dL — AB (ref 3.4–5.0)
AST: 31 U/L (ref 15–37)
Alkaline Phosphatase: 66 U/L
Anion Gap: 3 — ABNORMAL LOW (ref 7–16)
BUN: 22 mg/dL — AB (ref 7–18)
Bilirubin,Total: 0.8 mg/dL (ref 0.2–1.0)
CALCIUM: 9 mg/dL (ref 8.5–10.1)
CO2: 35 mmol/L — AB (ref 21–32)
CREATININE: 0.9 mg/dL (ref 0.60–1.30)
Chloride: 99 mmol/L (ref 98–107)
EGFR (African American): >60
EGFR (Non-African Amer.): 56 — ABNORMAL LOW
Glucose: 111 mg/dL — ABNORMAL HIGH (ref 65–99)
OSMOLALITY: 278 (ref 275–301)
POTASSIUM: 4.2 mmol/L (ref 3.5–5.1)
SGPT (ALT): 29 U/L
Sodium: 137 mmol/L (ref 136–145)
TOTAL PROTEIN: 5.7 g/dL — AB (ref 6.4–8.2)

## 2013-12-10 LAB — CBC WITH DIFFERENTIAL/PLATELET
Bands: 2 %
COMMENT - H1-COM1: NORMAL
HCT: 37.3 % (ref 35.0–47.0)
HGB: 12.1 g/dL (ref 12.0–16.0)
Lymphocytes: 7 %
MCH: 32.3 pg (ref 26.0–34.0)
MCHC: 32.4 g/dL (ref 32.0–36.0)
MCV: 100 fL (ref 80–100)
Monocytes: 6 %
PLATELETS: 96 10*3/uL — AB (ref 150–440)
RBC: 3.74 10*6/uL — ABNORMAL LOW (ref 3.80–5.20)
RDW: 15.4 % — ABNORMAL HIGH (ref 11.5–14.5)
SEGMENTED NEUTROPHILS: 85 %
WBC: 12.8 10*3/uL — AB (ref 3.6–11.0)

## 2013-12-10 LAB — URINALYSIS, COMPLETE
BILIRUBIN, UR: NEGATIVE
Bacteria: NONE SEEN
KETONE: NEGATIVE
LEUKOCYTE ESTERASE: NEGATIVE
Nitrite: NEGATIVE
PROTEIN: NEGATIVE
Ph: 7 (ref 4.5–8.0)
Specific Gravity: 1.013 (ref 1.003–1.030)
Squamous Epithelial: <1
WBC UR: 3 /HPF (ref 0–5)

## 2013-12-10 LAB — PROTIME-INR
INR: 3.2
PROTHROMBIN TIME: 31.6 s — AB (ref 11.5–14.7)

## 2013-12-10 LAB — PHOSPHORUS: PHOSPHORUS: 2.6 mg/dL (ref 2.5–4.9)

## 2013-12-10 LAB — TROPONIN I: Troponin-I: <0.02 ng/mL

## 2013-12-10 LAB — MAGNESIUM: Magnesium: 1.9 mg/dL

## 2013-12-11 LAB — PROTIME-INR
INR: 2.6
Prothrombin Time: 27.3 secs — ABNORMAL HIGH (ref 11.5–14.7)

## 2013-12-11 LAB — URINE CULTURE

## 2013-12-15 LAB — CULTURE, BLOOD (SINGLE)

## 2013-12-15 LAB — PROTIME-INR
INR: 5.1
Prothrombin Time: 45.1 secs — ABNORMAL HIGH (ref 11.5–14.7)

## 2013-12-16 LAB — PROTIME-INR
INR: 2.6
Prothrombin Time: 27.4 secs — ABNORMAL HIGH (ref 11.5–14.7)

## 2013-12-18 ENCOUNTER — Ambulatory Visit: Payer: Self-pay | Admitting: Internal Medicine

## 2013-12-19 LAB — PROTIME-INR
INR: 2.5
PROTHROMBIN TIME: 26.7 s — AB (ref 11.5–14.7)

## 2013-12-31 ENCOUNTER — Encounter: Payer: Self-pay | Admitting: Internal Medicine

## 2013-12-31 DEATH — deceased

## 2014-07-23 NOTE — Op Note (Signed)
PATIENT NAME:  Crystal Goodwin, Crystal Goodwin MR#:  [id number] DATE OF BIRTH:  Mar 06, 1922  DATE OF PROCEDURE:  09/01/2012  PREOPERATIVE DIAGNOSIS: Recurrent incisional hernia.   POSTOPERATIVE DIAGNOSIS: Recurrent incisional hernia.   OPERATION PERFORMED: Laparoscopy and conversion to open repair of incisional hernia.   SURGEON: S.G. Evette Cristal, M.D.   ANESTHESIA: General.   COMPLICATIONS: None.   ESTIMATED BLOOD LOSS: Less than 50 mL.   DRAINS: None.   DESCRIPTION OF PROCEDURE: The patient was put to sleep in a supine position on the operating table. A Foley catheter was inserted. The abdomen was prepped and draped out as a sterile field. A timeout was performed. The patient had a previous large incisional hernia repair done in the midportion of the abdomen with the lower edge of the mesh being somewhere in the region of the suprapubic area. Her current hernia seemed to be located overlying the pubic bone, and the actual fascial opening was somewhat difficult to define on the CT. A laparoscopy was attempted first. Given the fact that the patient had a large mesh in the middle and a previous left upper quadrant incision for a splenectomy, the right side was chosen. A small incision was made in the right subcostal region, and the Veress needle with the InnerDyne sleeve was positioned in the peritoneal cavity, verified with the hanging drop method, and pneumoperitoneum was obtained. A 10 mm port was placed. The camera was introduced. It was noted the patient had dense adhesions of both the omentum and bowel to the anterior abdominal wall in its entirety, leaving a small window just in the lowermost part of the abdomen. The right side was reasonably clear. Some loops of bowel were noted in the region of the suprapubic region. These were carefully taken down after placing a 5 mm port laterally on the right and using scissors and some blunt gentle dissection, this was freed from the abdominal wall. Evaluation suggested  that she might have a small hernial opening just above the pubis and to the left of the midline. There did not appear to be any other apparent hernias. It was felt, given the limited space, that this hernia is best dealt with by an open incision. Ports were removed. The lowermost part of the midline incision was then reopened approximately 2.5 to 3 inches in length and deepened through the layers until the fascia and mesh were identified. The fascia and mesh were then incised along the same line, and the edges were lifted up to free up the adhesions in this area. This was done fairly easily with blunt dissection. Further lysis of adhesions was performed to expose the defect that was located just above the pubis on the left side. This area was satisfactorily exposed, and a Physiomesh was brought up to the field, 10 x 15 cm oval shape but this was cut to about half of its size and then placed across the defect in the suprapubic region. It was then tacked to the sides with the secure strap and also with a few stitches of 0 Prolene. This area was adequately covered, and the incision was then closed. The fascia along with the previous mesh was then reapproximated with interrupted figure-of-eight stitches of 0 Prolene. Subcutaneous tissue was closed with 3-0 Vicryl, and the skin was closed with staples, also the port sites laterally on the right. Dry sterile dressings were placed. The patient tolerated the procedure well. Foley catheter was removed, and she was returned to the recovery room in stable condition.  ____________________________ S.Wynona LunaG. Sloan Takagi, MD sgs:gb D: 09/01/2012 16:43:38 ET T: 09/01/2012 21:38:12 ET JOB#: [id number]  cc: Timoteo ExposeS.G. Evette CristalSankar, MD, <Dictator> [facility] Wynona LunaG Silas Sedam MD ELECTRONICALLY SIGNED 09/03/2012 8:07

## 2014-07-23 NOTE — Consult Note (Signed)
PATIENT NAME:  Crystal Goodwin, Crystal Goodwin MR#:  [id number] DATE OF BIRTH:  06-Feb-1922  DATE OF CONSULTATION:  08/20/2012  CONSULTING PHYSICIAN:  Nicholous Girgenti H. Posey Pronto, MD  PRIMARY CARE PROVIDER:  Dr. Apolonio Schneiders  REFERRING PHYSICIAN:  Dr. Jamal Collin   REASON FOR CONSULT:  Hypertension, diabetes, depression, AFib.  HISTORY OF PRESENT ILLNESS: The patient is an 81 year old white female who was admitted by Dr. Jamal Collin earlier in the day for nausea, vomiting, abdominal pain. Patient noted to have abnormal CT scan, and she is admitted for further observation and possible surgical intervention. The patient reports that all her symptoms started yesterday when she started becoming nauseous and started throwing up. She also reports that her last bowel movement was a few days ago. She also noticed her abdomen was distended. She came to the ED and had a CT scan. CT scan of the abdomen and pelvis showed findings concerning for small bowel obstruction, as well as some issues with her abdominal hernia. The patient otherwise reports that she does get short of breath with exertion, but denies any chest pains, palpitations. No syncope. Denies any fevers or chills.   PAST MEDICAL HISTORY:  Significant for  1.  Diabetes type 2.  2.  Hypertension.  3. History of AFib. History of having a PE in the past as well as a DVT. Is on chronic anticoagulation. Has had IVC filter in the past.  4.  History of degenerative disk disease.  5.  History of recurrent UTIs.  6.  History of kidney stones.  7.  Status post right total knee replacement.  8.  Status post hernia repair.  9.  Status post splenectomy.  10.  Status post hysterectomy.   ALLERGIES: MOBIC, SULFA, MACRODANTIN and PENICILLIN.   CURRENT MEDICATIONS AT HOME:  She is on Xanax 0.25 daily, Citrucel plus vitamin D 2 times a day, Coumadin 4 mg 4 times a week, alternating with 5 mg 3 times a week, diltiazem CD 180 daily, doxycycline 100 daily, gabapentin 800 daily, glimepiride 2 mg  daily, hydrochlorothiazide 25 p.o. daily, multivitamin daily, Viibryd 40 mg 1 tab p.o. daily.   SOCIAL HISTORY:  Does not smoke. Does not drink. No drugs.   FAMILY HISTORY:  Positive for hypertension.   REVIEW OF SYSTEMS:  CONSTITUTIONAL: Denies any fevers. Complains of some fatigue, weakness, abdominal pain. No weight loss. No weight gain.  EYES: No blurry or double vision. No pain. No redness or inflammation.  EARS, NOSE, THROAT:  No tinnitus. No ear pain. No hearing loss. No seasonal or year-round allergies. No epistaxis. No nasal discharge. No snoring. No postnasal drip. No difficulty swallowing.  RESPIRATORY: Denies any cough, wheezing, hemoptysis. No COPD, no TB.  CARDIOVASCULAR: Denies any chest pain, orthopnea, edema or arrhythmia. Complains of some dyspnea on exertion.  GASTROINTESTINAL:  Complains of nausea, vomiting as above as well as abdominal pain. No hematemesis. No melena. No ulcer. No GERD. No IBS. No jaundice.  GENITOURINARY: Denies any dysuria, hematuria, renal calculus or frequency.  ENDOCRINE:  Denies any polyuria, nocturia or thyroid problems.  HEMATOLOGIC/LYMPHATIC: Denies bleeding or  bruisability or bleeding.  SKIN:  No acne. No rash. No changes in mole, hair or skin.  MUSCULOSKELETAL: Denies any pain in neck, back or shoulder.  NEUROLOGIC: No numbness. No CVA. No TIA. No seizures.  PSYCHIATRIC: Has a history of depression. Denies any anxiety or insomnia.   PHYSICAL EXAMINATION: VITAL SIGNS: Temperature 97.9, pulse 99, respirations 18, blood pressure 104/58, O2 94%.  GENERAL: The patient  is a well-developed, well-nourished female in no acute distress.  HEENT: Head:  Atraumatic, normocephalic. Eyes: Pupils equally round, react to light and accommodation. There is no conjunctival pallor. No scleral icterus. Extraocular movements intact. Nose, there is no drainage or masses. Ears: No erythema or lesions noted. Mouth:  No exudate. No mass.  NECK:  Supple and symmetric.  No masses. Thyroid is not enlarged. No JVD.  RESPIRATORY: Good respiratory effort. Clear to auscultation. No rales, rhonchi or wheezing.  CARDIOVASCULAR: Regular rate and rhythm. No murmurs, gallops, clicks, heaves or rubs. The PMI is not displaced.  ABDOMEN: Distended. Bowel sounds are diminished. There is no tenderness or guarding. There is no hepatosplenomegaly.   EXTREMITIES: There is no clubbing, cyanosis, or edema.  GENITOURINARY: Deferred.  MUSCULOSKELETAL: There is no erythema or swelling.  SKIN:  There are no masses or any rash.  LYMPHATICS: No lymph nodes palpable.  VASCULAR: Good DP, PT pulses.  NEUROLOGICAL: Cranial nerves II through XII grossly intact. Reflexes 2+.  PSYCHIATRIC: Not anxious or depressed.   EVALUATION IN THE ED:  Glucose 229, BUN 21, creatinine 1.37, sodium 138, potassium 4.5, chloride 101, CO2 is 28, calcium 9.7, lipase 98. LFTs: Total protein 8.5, albumin 3.4, bili total 0.5, alk phos is 83, AST 49. Troponin less than 0.02. WBC 9.7, hemoglobin 14.6, platelet count 314. INR is 2.4.   ASSESSMENT AND PLAN: The patient is an 81 year old white female admitted with abdominal pain.   1.  Diabetes. At this time, will hold p.o. glipizide. Since she is n.p.o., will place her on sliding scale insulin with q. 6 Accu-Cheks.   2. Hypertension. Recommend holding hydrochlorothiazide. Will continue Cardizem. Will follow her blood pressure.   3.  Atrial fibrillation. Coumadin on hold for possible surgery. Will continue Cardizem. Follow her heart rate.   4.  Depression. Hold Viibryd for now.   5.  History of deep vein thrombosis and pulmonary embolism in the past. Has IVC filter in place. Do recommend starting patient on at least heparin for DVT prophylaxis, if felt appropriate by Surgery.   6.  Mildly elevated creatinine, possibly due to dehydration.   Recommend continuing fluids, as you are doing. Will check a BNP in the morning.  NOTE:  45 minutes  spent.   ____________________________ Lafonda Mosses. Posey Pronto, MD shp:mr D: 08/20/2012 18:56:52 ET T: 08/20/2012 20:31:48 ET JOB#: [id number]  cc: Wisdom Seybold H. Posey Pronto, MD, <Dictator> Alric Seton MD ELECTRONICALLY SIGNED 08/28/2012 14:55

## 2014-07-23 NOTE — Discharge Summary (Signed)
PATIENT NAME:  Crystal Goodwin, Crystal Goodwin MR#:  [id number] DATE OF BIRTH:  02-10-1922  DATE OF ADMISSION:  08/20/2012 DATE OF DISCHARGE:  08/26/2012  HISTORY OF PRESENT ILLNESS: This is an 81 year old female, who presented to the Emergency Room with complaints of crampy abdominal pain, nausea and vomiting. Her primary symptom was that of nausea and vomiting. She also complained of a lot of burping. The symptoms started a day prior to her admission and continued all night and she did after present to the Emergency Room. She had no accompanying symptoms of fever or chills. She notes that she had been a little bit more constipated in the last 2 months.   PAST HISTORY: In 1999, she underwent repair of a large incisional hernia with mesh. She has since had a total knee replacement about 2 years ago, which was uneventful, but she did develop DVT, which she had had a history of in the past. A filter was placed prior to the procedure and then removed subsequently. She had another episode of DVT about a year ago and the patient was currently on Coumadin. She also had a history of atrial fibrillation. Other conditions included diabetes type 2, history of kidney stones and hypertension. She had had previous bilateral breast biopsies and splenectomy in the distant past and a total hysterectomy.   PHYSICAL EXAMINATION: Revealed that the patient had a moderately distended abdomen, which was somewhat difficult to evaluate given the fact that she had a large lower abdominal vertical incision with a large mesh that was placed at the time of her previous repair. It appeared however, that she might have a hernia well over the pubic symphysis region, but an actual fascial defect could not be palpated. This did not; however, appear to be incarcerated. A CT scan was reviewed and showed that the patient had features suggesting a partial small bowel obstruction, likely in the region of the hernia located in the lower abdomen. A focal point of  obstruction was not definitely seen, but there was a change in caliber of the bowel leading from the point of the hernia distally.   COURSE IN THE HOSPITAL: The patient was admitted and internal medicine consultation was obtained in view of her history of DVTs other medical conditions. She was placed n.p.o. and on IV fluids. Over the next 2 to 3 days, the patient had absolutely no recurrence of abdominal pain. No nausea or vomiting. Her Coumadin was held and the patient monitored closely with pro time. On 05/23, the patient remained asymptomatic and she had had some limited bowel activity following a Fleet's enema. She was started, at that time, on a clear liquid diet, which she seemed to tolerated well  with no recurrence of flatulence, nausea, vomiting or abdominal pain. It was felt that if the patient's symptoms would improve that she would be best served by an elective repair of this hernia. On 05/26, the patient had been continued on a clear liquid diet, which she tolerated well. Her Coumadin pro time had drifted down and she was started on some Lovenox. The patient reported having a good bowel movement on 05/25. She was advanced to a full liquid diet and maintained on this until her discharge on 08/26/2012. The role of  anticoagulation was discussed with internal medicine. It was decided to give her therapeutic doses until the time of her elective repair scheduled for the next week. The patient was advised to take 70 mg of Lovenox daily and then she was to be  seen in the office a couple of days prior to the procedure. The patient subsequently was discharged home in stable condition on 08/26/2012 with arrangements made for her surgery in the next week.    FINAL DIAGNOSES:  1.  Recurrent incisional hernia.  2.  Small bowel obstruction.  3.  History of deep vein thrombosis.  4.  Hypertension.  5.  History of diabetes mellitus.   OPERATION PERFORMED: None.  ____________________________ S.Wynona LunaG. Jacquelyne Quarry,  MD sgs:aw D: 09/08/2012 19:03:26 ET T: 09/09/2012 06:24:07 ET JOB#: [id number]  cc: Timoteo ExposeS.G. Evette CristalSankar, MD, <Dictator> [facility] Wynona LunaG Bernadean Saling MD ELECTRONICALLY SIGNED 09/10/2012 8:51

## 2014-07-23 NOTE — H&P (Signed)
Subjective/Chief Complaint nausea and vomiting.   History of Present Illness 81 yr old female presented to ER with c/o n/v and a lot of burping with abd pain . Started yesterday. Lasted all night. At present she feels a little better. No fever or chills. She also has had some constipation in last 2 mos.   Past History In 1999 she had repair of a large incisional hernia with mesh.  Had right TKR about 2 yrs ago , developed DVT. A filter was placed then and suvbsequently removed. About 1 yr agi she had another episode of DVT in right leg-currently on coumadin.   Past Med/Surgical Hx:  Degenerative Disc Disease:   Urinary Tract Infection:   Diabetes Mellitus, Type II (NIDD):   Kidney Stones:   htn:   right total knee replacement:   Knee Surgery - Right: Right total knee replacement  bilateral breast biopsies:   Exploratory abdominal surgery: blockage  Hernia Repair:   Splenectomy:   Hysterectomy - Total:   ALLERGIES:  Sulfa: Unknown  Macrodantin: Unknown  Penicillin: Unknown  Mobic: Rash  HOME MEDICATIONS: Medication Instructions Status  glimepiride 2 mg oral tablet 1  orally once a day  Active  Citracal + D   2 times a day  Active  multivitamin   once a day  Active  hydrochlorothiazide 25 mg oral tablet 1 tab(s) orally once a day Active  Coumadin  orally 4 mg 4 days a week alternate 5 mg three days a week Active  Viibryd 40 mg oral tablet 1 tab(s) orally once a day Active  gabapentin 800 mg oral tablet 1  orally once a day (at bedtime) Active  Diltiazem Hydrochloride CD 180 mg/24 hours oral capsule, extended release 1 cap(s) orally once a day Active  Xanax 0.25 mg oral tablet 0.5  orally once a day Active  doxycycline 100 milligram(s) orally once a day Active   Review of Systems:  Subjective/Chief Complaint abd pain, n/v   Fever/Chills No   Cough No   Abdominal Pain Yes   Diarrhea No   Constipation Yes   Nausea/Vomiting Yes   SOB/DOE No   Chest Pain No    Medications/Allergies Reviewed Medications/Allergies reviewed   Physical Exam:  GEN well nourished, no acute distress   HEENT pink conjunctivae, PERRL   NECK supple  No masses   RESP normal resp effort  clear BS   CARD regular rate  no murmur  LE edema present   ABD denies tenderness  no liver/spleen enlargement  distended  normal BS  Although distended  no tympany noted.   LYMPH negative neck   EXTR positive edema, Pt has palpable DP pulses, PT not palpable.   SKIN No rashes   PSYCH A+O to time, place, person, good insight   Lab Results: Hepatic:  21-May-14 10:47   Bilirubin, Total 0.5  Alkaline Phosphatase 83  Total Protein, Serum  8.5  Albumin, Serum 3.4  Routine Chem:  21-May-14 10:47   Glucose, Serum  229  BUN  21  Creatinine (comp)  1.37  Sodium, Serum 138  Potassium, Serum 4.5  Chloride, Serum 101  CO2, Serum 28  eGFR (African American)  40  eGFR (Non-African American)  34 (eGFR values <21mL/min/1.73 m2 may be an indication of chronic kidney disease (CKD). Calculated eGFR is useful in patients with stable renal function. The eGFR calculation will not be reliable in acutely ill patients when serum creatinine is changing rapidly. It is not useful in  patients on dialysis. The eGFR calculation may not be applicable to patients at the low and high extremes of body sizes, pregnant women, and vegetarians.)  Lipase 98 (Result(s) reported on 20 Aug 2012 at 11:24AM.)   Radiology Results: XRay:    21-May-14 11:34, Chest Portable Single View  Chest Portable Single View  REASON FOR EXAM:    abd pain  COMMENTS:       PROCEDURE: DXR - DXR PORTABLE CHEST SINGLE VIEW  - Aug 20 2012 11:34AM     RESULT: Comparison is made to the study of July 29, 2009.    The lungs are adequately inflated. There is no focal infiltrate. The   cardiac silhouette is enlarged. The pulmonary vascularity is not engorged   there is mild tortuosity of the descending thoracic aorta. There  is no   pleural effusion.    IMPRESSION:  There is no evidence of pneumonia. There is enlargement of   thecardiac silhouette which is accentuated by the AP portable technique.   A followup PA and lateral chest x-ray would be of value when the patient     can tolerate the procedure.     Dictation Site: 2        Verified By: DAVID A. Martinique, M.D., MD  LabUnknown:  PACS Image  CT:    21-May-14 11:59, CT Abdomen and Pelvis Without Contrast  CT Abdomen and Pelvis Without Contrast  REASON FOR EXAM:    (1) abd pain vomiting; (2) abd pain vomiting  COMMENTS:       PROCEDURE: CT  - CT ABDOMEN AND PELVIS W0  - Aug 20 2012 11:59AM     RESULT: Comparison: 05/03/2009    Technique: Multiple axial images from the lung bases to the symphysis   pubis were obtained without oral and without intravenous contrast.    Findings:  Mild basilar opacities are likely secondary to atelectasis. There is a   small hiatal hernia. Calcifications are seen in the coronary arteries.    Lack of intravenous contrast limits evaluation of the solid abdominal     organs.  Grossly, the liver, gallbladder, adrenals, and pancreas are   unremarkable. The spleen is absent. Minimal nodularity along the inferior   margin of the left hemidiaphragm may be related to small splenules. There   is mild atrophy of the left kidney. Multiple calculi are seen in the   bilateral kidneys. No hydronephrosis or ureterectasis.    Postoperative changes seen from ventral hernia mesh repair. There are   multiple dilated loopsof small bowel. There are are multiple   decompressed distal small bowel in the right lower quadrant. There is a   small hernia containing a loop of small bowel which extends inferiorly   from the inferior margin of the mesh hernia repair. The bowel extending   distally demonstrates fecalization as well as most of the distal small   bowel distal to this site is decompressed. The findings are concerning   for small  bowel obstruction at this site. The appendix is not identified.   There are no inflammatory changes at the base of the cecum.  No aggressive lytic or sclerotic osseous lesions are identified.    IMPRESSION:   Findings concerning for small bowel obstruction. There is a small hernia   containing a loop of small bowel extending inferiorly into the pannus   just inferior to the mesh hernia repair which is concerning for the site   of obstruction.  Verified By: Gregor Hams, M.D., MD    Assessment/Admission Diagnosis CT reviewed. There is a hernia in inferior abdomen with bowel loops. There does appear to be change in bowel caliber to normal past the hernia.There is air and stool  in colon.   Plan Will admity patient. Follow clinical course, hold coumadin. If no better in a couple of days will likely need surgical intervention. Discussed fully with patient.   Electronic Signatures: Christene Lye (MD)  (Signed 21-May-14 14:29)  Authored: CHIEF COMPLAINT and HISTORY, PAST MEDICAL/SURGIAL HISTORY, ALLERGIES, HOME MEDICATIONS, REVIEW OF SYSTEMS, PHYSICAL EXAM, LABS, Radiology, ASSESSMENT AND PLAN   Last Updated: 21-May-14 14:29 by Christene Lye (MD)

## 2014-07-24 NOTE — Consult Note (Signed)
   Present Illness 81 yo female with history of afib who also has history of frequent utis who had presented to the er recently with sob, weakness and fever. She was felt stable to discharge to home. She returned less than 24 hours later with profound wekness, fatigue, fever and chills. She was in afib with rvr. She has imporved with antibiotics and oxygen. She was profoundly hypoxic on admission as well. Her heart rate has improved somewhat and her mental status has returned to her baseine. She is stable at [facility]. EKG reveals afib with variable ventricular repsonse. She denies chest pain or sob at [facility].   Physical Exam:  GEN no acute distress   HEENT PERRL   NECK No masses   RESP no use of accessory muscles  rhonchi   CARD Irregular rate and rhythm   ABD denies tenderness  soft   LYMPH negative neck   EXTR negative cyanosis/clubbing   SKIN No rashes   NEURO cranial nerves intact, motor/sensory function intact   PSYCH A+O to time, place, person   Review of Systems:  Subjective/Chief Complaint weakness and fatigue   General: Fatigue  Weakness   Skin: No Complaints   ENT: No Complaints   Eyes: No Complaints   Neck: No Complaints   Respiratory: Short of breath   Cardiovascular: No Complaints   Gastrointestinal: No Complaints   Genitourinary: No Complaints   Vascular: No Complaints   Musculoskeletal: No Complaints   Neurologic: No Complaints   Hematologic: No Complaints   Endocrine: No Complaints   Psychiatric: No Complaints   Review of Systems: All other systems were reviewed and found to be negative   Medications/Allergies Reviewed Medications/Allergies reviewed   Family & Social History:  Family and Social History:  Family History Non-Contributory   EKG:  Abnormal NSSTTW changes   Interpretation afib with variable ventricular response    Sulfa: Unknown  Macrodantin: Unknown  Penicillin: Unknown  Mobic: Rash   Impression 81 yo female  with hisoty of afib admiitted with hypoxia, weakness and fatigue. She was treated with oxygen and emperic antibiotics she had imptroved. rate of herafib is imporved. Would continue with current meds. and avoid chornic anticoagulation due ot fall risk and advnaced age.   Plan 1 Conintue iwth diltiazem for rate control and continue with asa. 2. Emperica abx. 3. Agree with consideration for placment 4. Will follow with you   Electronic Signatures: Dalia HeadingFath, Eland Lamantia A (MD)  (Signed 25-Aug-15 20:53)  Authored: General Aspect/Present Illness, History and Physical Exam, Review of System, Family & Social History, EKG , Allergies, Impression/Plan   Last Updated: 25-Aug-15 20:53 by Dalia HeadingFath, Coyle Stordahl A (MD)

## 2014-07-24 NOTE — Discharge Summary (Signed)
PATIENT NAME:  Crystal Goodwin, Crystal Goodwin MR#:  [id number] DATE OF BIRTH:  1921-05-05  DATE OF ADMISSION:  11/21/2013 DATE OF DISCHARGE:  11/27/2013  DISCHARGE DIAGNOSES: 1.  Diverticulitis.  2.  Acute respiratory failure.  3.  Wheezing, asthma exacerbation. 4.  Left lower lobe pneumonia.  5.  Atrial fibrillation.   DISCHARGE MEDICATIONS: [facility]. Basically, will be on [facility] prednisone taper 60 [facility] day for 5 days, then 40 [facility] day for 5 days, then 20 [facility] day for 5 days. Will be on multiple antibiotics as ehrlichiosis panel is not back yet and we are treating 4 more days with doxycycline, p.o. metronidazole and p.o. Levaquin. She will be on 3 mg of warfarin while she is still on her antibiotics. At this point, we will follow her pro time closely; the next should be Monday. Otherwise, her per med reconciliation as I stated.   HISTORY AND PHYSICAL: Please see detailed history and physical done on admission.   HOSPITAL COURSE: The patient was admitted with nausea and vomiting, inability to tolerate p.o., as well as hypoxia on exertion. She became much more short of breath while in the hospital with marked wheezing. Chest x-ray revealed possible infiltrate. She was treated for that as well as treated for the wheezing with Solu-Medrol and inhalers, which had been added as well. She slowly improved. Her lungs were clear this morning. She is taking p.o. well. Her abdomen, which was tender on admission, is no longer tender. She was still weak, did not get around well. Although she has 24 hour care at home she needs to be more ambulatory so she will be discharged to [facility] for more PT/OT, etc. She also needs [facility] speech pathology consult to work closely on her swallowing with potential aspiration causing pneumonia, wheezing, etc. INR was noted to be 1.6 today. She will be on p.o. potassium with slightly decreased potassium today. She will be given some IV potassium prior to discharge as well.  Abdominal CT was not with any significant findings. She did have some hyperglycemia on her steroids. She developed leukocytosis prior to adding metronidazole for diverticulitis.   TIME SPENT: Of note, it took approximately 35 minutes to do all discharge tasks today.  ____________________________ Marya AmslerMarshall W. Dareen PianoAnderson, MD mwa:sb D: 11/27/2013 07:51:39 ET T: 11/27/2013 08:05:50 ET JOB#: [id number]  cc: Marya AmslerMarshall W. Dareen PianoAnderson, MD, <Dictator> Lauro RegulusMARSHALL W ANDERSON MD ELECTRONICALLY SIGNED 11/30/2013 7:31

## 2014-07-24 NOTE — H&P (Signed)
PATIENT NAME:  Crystal Goodwin, Crystal Goodwin MR#:  [id number] DATE OF BIRTH:  November 14, 1921  DATE OF ADMISSION:  11/21/2013  REFERRING PHYSICIAN: Lurena Joiner L. Shaune Pollack, MD.  FAMILY PHYSICIAN: Marya Amsler. Dareen Piano, MD.   REASON FOR ADMISSION: Progressive shortness of breath with dyspnea.   HISTORY OF PRESENT ILLNESS: The patient is a 81 year old female followed by Dr. Dareen Piano. The patient has a history of atrial fibrillation, previous pulmonary emboli, recurrent DVTs, and degenerative disk disease. Presents to the Emergency Room with progressive weakness, failure to thrive and shortness of breath. Had fevers with nausea, vomiting yesterday and was sent home from the Emergency Room. Today, the patient is profoundly weak. She is short of breath and dyspneic with exertion.  Saturations dropped down to the 72 range on room air with exertion. Denies chest pain. Unable to care for herself at home. She is now admitted for further evaluation.   PAST MEDICAL HISTORY: 1.  Paroxysmal atrial fibrillation.  2.  Chronic anemia.  3.  History of pulmonary emboli.  4.  Recurrent DVTs.  5.  Degenerative disk disease.  6.  Type 2 diabetes.  7.  Recurrent UTIs .  8.  History of small bowel obstruction.  9.  Nephrolithiasis.  10.  Benign hypertension.  11.  Status post splenectomy.  12.  Status post hysterectomy.  13.  Status post right total knee replacement.  14.  Status post IVC filter placement.   MEDICATIONS: 1.  Zofran 4 mg p.o. q. 8 hours p.r.n.  2.  Viibryd 40 mg p.o. daily.  3.  Levaquin 500 mg p.o. daily.  4.  Hydrochlorothiazide 25 mg p.o. daily.  5.  Amaryl 2 mg p.o. daily.  6.  Gabapentin 800 mg p.o. at bedtime.  7.  Cardizem CD 180 mg p.o. daily.  8.  Xanax 0.5 mg p.o. daily as needed.  9.  Percocet 1 p.o. q. 4 hours p.r.n. pain.   ALLERGIES: MOBIC, SULFA, MACRODANTIN, PENICILLIN.   SOCIAL HISTORY: Negative for alcohol or tobacco abuse.   FAMILY HISTORY: Positive for coronary artery disease and  hypertension.   REVIEW OF SYSTEMS:  CONSTITUTIONAL: No fever or change in weight.  EYES: No blurred or double vision. No glaucoma.  ENT: No tinnitus or hearing loss. No nasal discharge or bleeding. No difficulty swallowing.  RESPIRATORY: The patient has had cough, but no wheezing or hemoptysis. No painful respiration.  CARDIOVASCULAR: No chest pain or orthopnea. No palpitations. No syncope.  GASTROINTESTINAL: Nausea, vomiting yesterday, but none today. Denies diarrhea or abdominal pain.  GENITOURINARY: No dysuria or hematuria. No incontinence.  ENDOCRINE: No polyuria or polydipsia. No heat or cold intolerance.  HEMATOLOGIC: The patient denies anemia, easy bruising or bleeding.  LYMPHATIC: No swollen glands.  MUSCULOSKELETAL: The patient denies pain in her neck, back, shoulders, knees, hips. No gout.  NEUROLOGIC: No numbness or migraines. Denies stroke or seizures.  PSYCHOLOGICAL: The patient denies anxiety, insomnia or depression.   PHYSICAL EXAMINATION: GENERAL: The patient is chronically ill-appearing, in mild respiratory distress.  VITAL SIGNS: Currently remarkable for a blood pressure of 118/61, respiratory rate 20, pulse 92, saturation 95% on oxygen. Exertional saturation was 72% on room air.   HEENT: Normocephalic, atraumatic. Pupils equally round and reactive to light and accommodation. Extraocular movements are intact. Sclerae are anicteric. Conjunctivae are clear.  Oropharynx is clear.  NECK: Supple without JVD. No adenopathy or thyromegaly is noted.  LUNGS: Reveal basilar crackles without wheezes or rales. No dullness. Respiratory effort is increased.  CARDIAC: Regular rate and  rhythm. Normal S1, S2. No significant rubs, murmurs or gallops. PMI is nondisplaced. Chest wall is nontender.  ABDOMEN: Soft, nontender, with normoactive bowel sounds. No organomegaly or masses were appreciated. No hernias or bruits were noted.  EXTREMITIES: Reveal trace edema, without clubbing or  cyanosis. Pulses are 1+ bilaterally.  SKIN: Warm and dry without rash or lesions.  NEUROLOGIC: Cranial nerves II through XII grossly intact. Deep tendon reflexes were symmetric. Motor and sensory exam is nonfocal.   LABORATORY DATA: CT of the abdomen today was unremarkable. Her white count is 11.1 with a hemoglobin of 13.7. Glucose is 127 with a BUN of 17, creatinine 1.39 with a GFR of 34. BNP is 1549. Troponin was less than 0.02.   ASSESSMENT: 1.  Acute respiratory distress.  2.  Shortness of breath with dyspnea.  3.  Hypoxia with saturations of 72% on room air with exertion.  4.  Stage III chronic kidney disease.  5.  History of atrial fibrillation.  6.  Recent febrile illness.   PLAN: The patient will be admitted to the floor with oxygen, IV antibiotics and DuoNeb SVNs. We will begin empiric Advair. We will follow her blood pressure closely. Will follow serial cardiac enzymes and obtain an echocardiogram. We will follow up a chest x-ray tomorrow. We will obtain a pulmonary consult because of her dyspnea and hypoxia. Physical therapy consult. Social work consult for placement. Follow up routine labs in the morning. Further treatment and evaluation will depend upon the patient's progress.   TOTAL TIME SPENT ON THIS PATIENT: 50 minutes.     ____________________________ Duane LopeJeffrey D. Judithann SheenSparks, MD jds:DT D: 11/21/2013 19:11:58 ET T: 11/21/2013 20:02:15 ET JOB#: [id number]  cc: Duane LopeJeffrey D. Judithann SheenSparks, MD, <Dictator> Marya AmslerMarshall W. Dareen PianoAnderson, MD Timara Loma Rodena Medin Boby Eyer MD ELECTRONICALLY SIGNED 11/22/2013 10:35

## 2014-07-25 NOTE — Discharge Summary (Signed)
PATIENT NAME:  Crystal Goodwin, Crystal Goodwin MR#:  [id number] DATE OF BIRTH:  24-Feb-1922  DATE OF ADMISSION:  03/14/2011 DATE OF DISCHARGE:  03/19/2011  FINAL DIAGNOSIS: Deep vein thrombosis, lower leg.  PROCEDURE PERFORMED DURING THIS ADMISSION: None.  COMPLICATIONS: None.  CONSULTATIONS: None.  HOSPITAL COURSE: The patient was seen in the [facility] office for leg swelling and leg pain. Ultrasound was conducted and found a lower leg DVT that was extremely mobile. The patient was admitted directly to [facility] directly from our office. At [facility], she was taken to her second floor room where she received heparin therapy. There were no complications with this therapy. INR remained stable was she was deemed stable for discharge on 03/19/2011.   LABORATORY DATA AS OF 03/19/2011: PT 23.9. INR 2.2. PTT 77.2. No diagnostics at this time.  DISCHARGE MEDICATIONS: 1. Gabapentin 800 mg. 2. Tylenol 500 to 1000 mg. 3. Glimepiride 2 mg oral tablet. 4. Citracal Plus Vitamin D. 5. Cardizem CD 120 mg 24 hour capsules. 6. Multivitamin. 7. Milk of Magnesia 30 mL. 8. Hydrochlorothiazide 25 mg. 9. Doxycycline hyclate 100 mg. 10. Vilazodone 20 mg. 11. Coumadin 7.5 mg.   DISPOSITION:  1. The patient was discharged home in a stable condition on 03/19/2011.  2. She will continue with her Coumadin therapy for at least the next 3 to 6 months.  3. She will resume her diet before admission. 4. She has been instructed not to do any heavy lifting.  5. She will follow-up with our office in 1 to 2 weeks with INR checks as well as evaluation with Philomena CourseJason Shauntell Iglesia, PA-C.  6. The patient understands to call with any problems or any questions.  ____________________________ Terrence DupontJason M. Nayara Taplin, PA-C jmk:drc D: 04/13/2011 07:58:00 ET T: 04/14/2011 13:03:02 ET JOB#: [id number]  cc: Terrence DupontJason M. Lorenso Quirino, PA-C, <Dictator>  Terrence DupontJASON M Sharanda Shinault PA ELECTRONICALLY SIGNED 04/17/2011  11:10

## 2014-08-29 IMAGING — CR DG ABDOMEN 2V
1 series · 3 of 3 positions shown · non-contrast
Comparison: none

REASON FOR EXAM: SBO
COMMENTS:

PROCEDURE:     DXR - DXR ABDOMEN 2 V FLAT AND ERECT  - August 21, 2012  [DATE]
RESULT:     Comparison: CT of the abdomen and pelvis 08/20/2012.

[Series 1: x abdomen supine · 0.14mm/px · 3 of 3 slices shown]
[im 1/3]
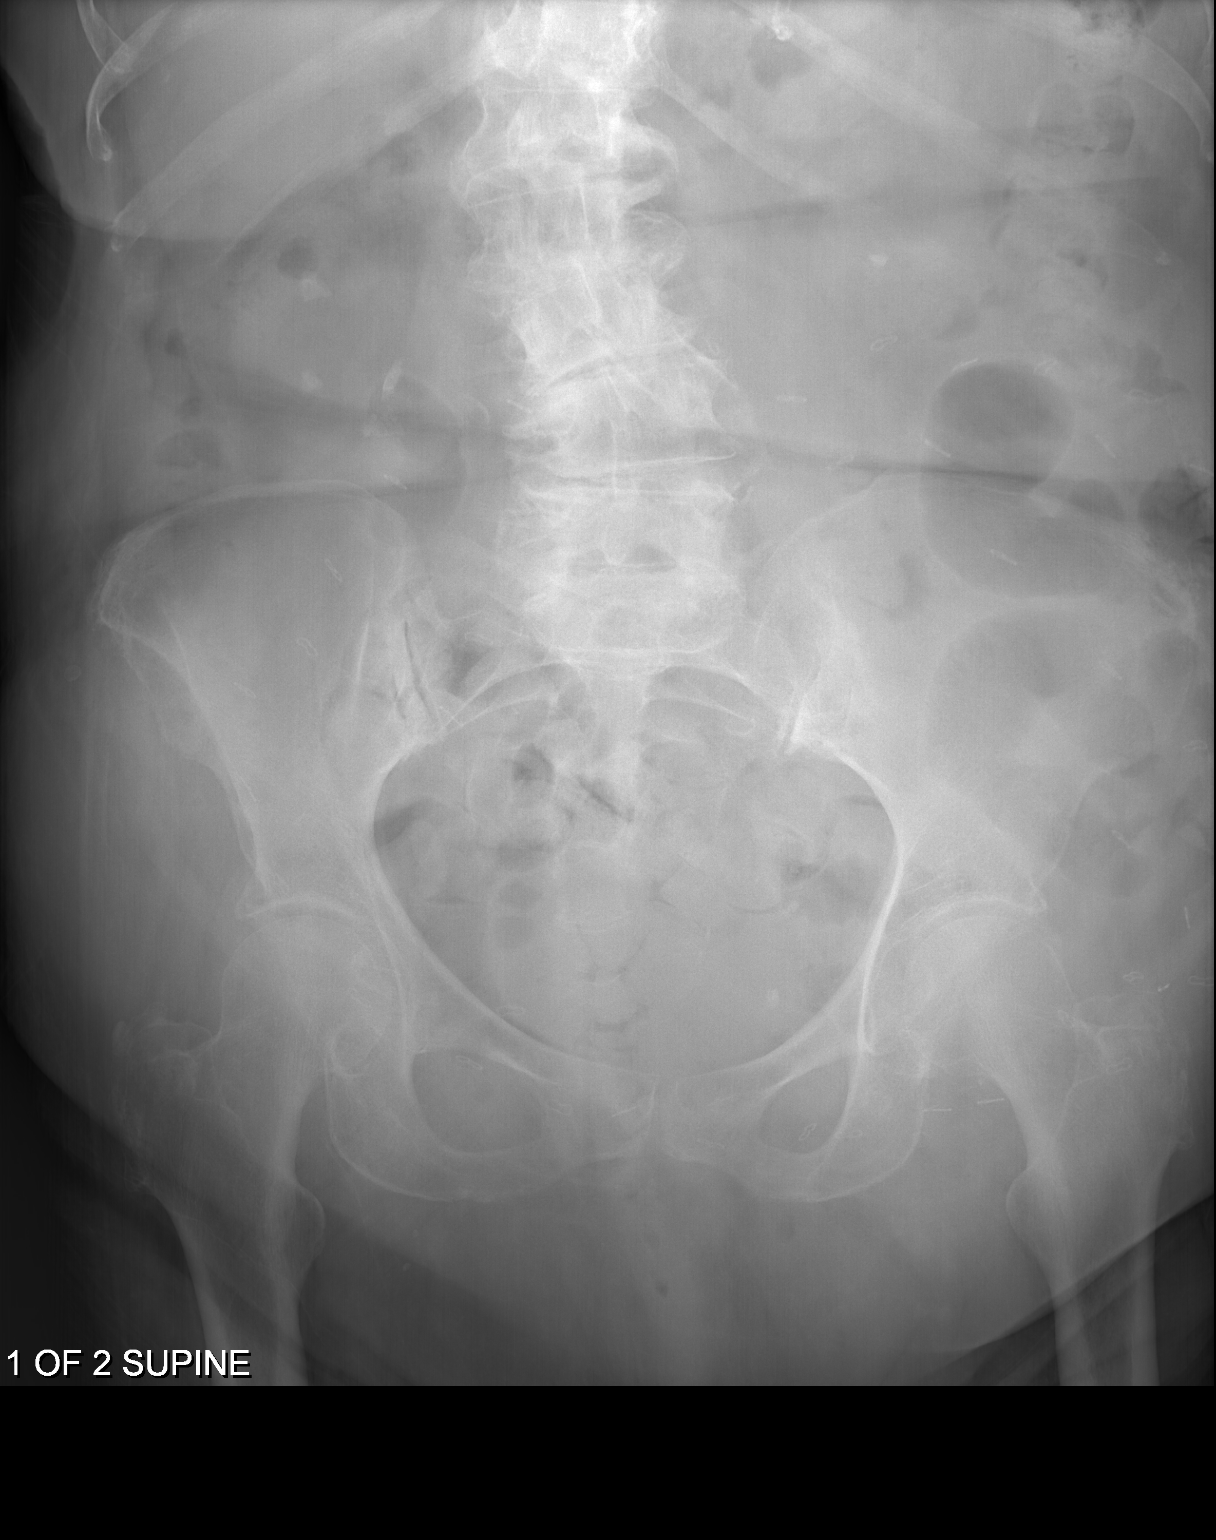
[im 2/3]
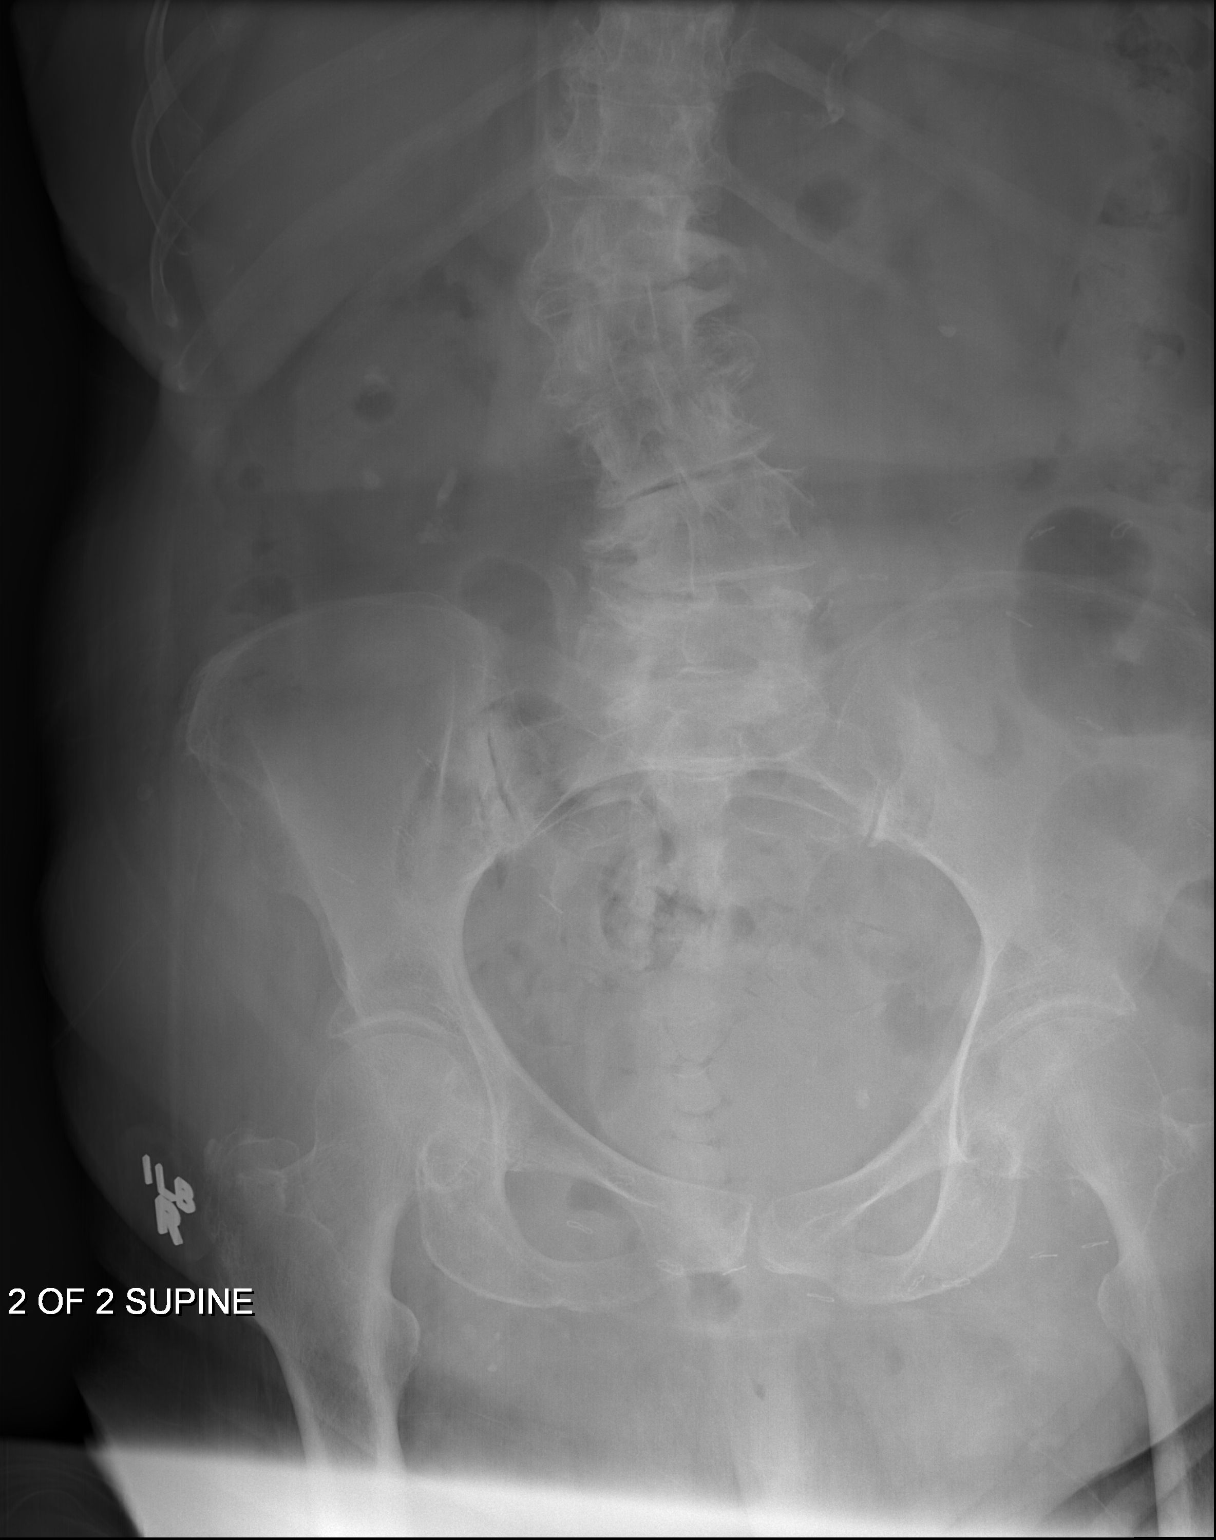
[im 3/3]
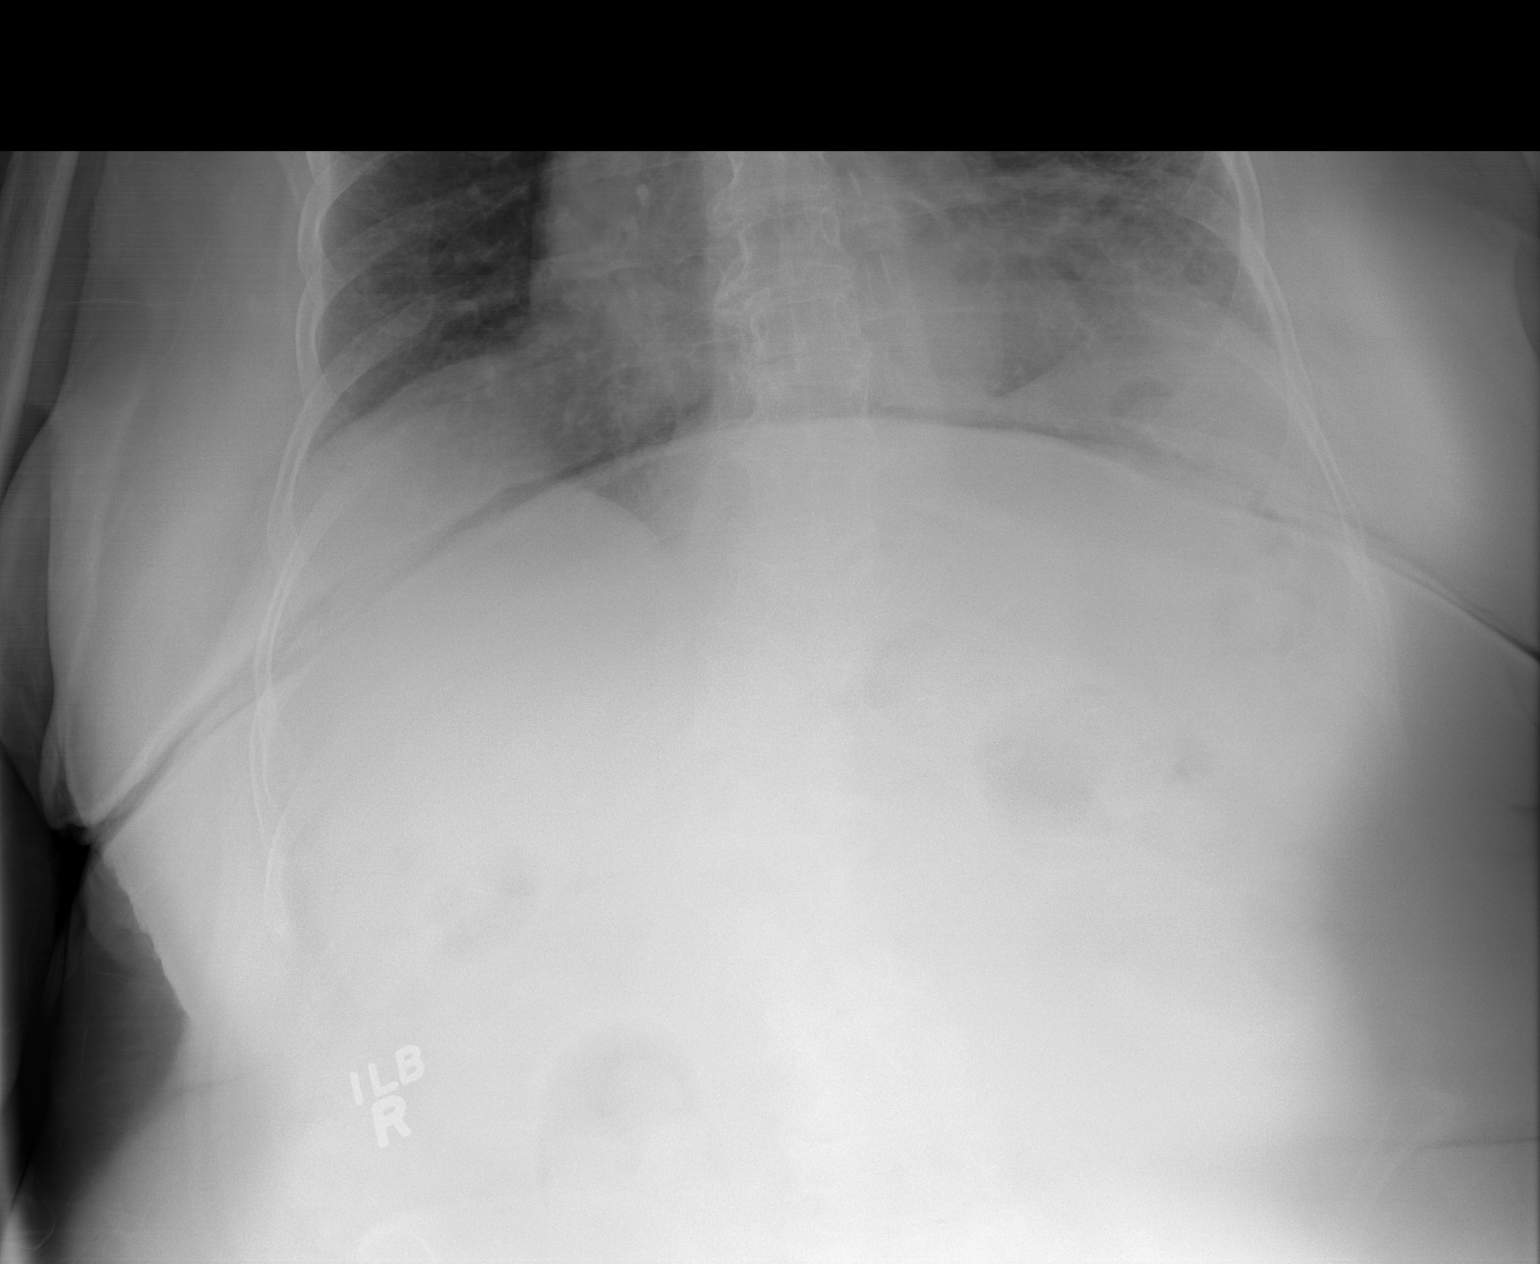

[3 of 3 positions shown; findings below may reference images not displayed]

FINDINGS: The far left lateral abdomen is excluded the field-of-view. No definite free
intraperitoneal air. Mild basilar opacities are likely secondary to
atelectasis. Air seen within mildly prominent loops of small bowel in the
left midabdomen. Multiple surgical clips overlie the abdomen. Possible
bilateral nephrolithiasis. Degenerative changes are seen lumbar spine.
IMPRESSION: Air seen within mildly prominent loops of small bowel in the left
midabdomen. The majority of the bowel loops on prior CT were fluid-filled.
Persistent bowel obstruction is not excluded.

[REDACTED]

## 2014-09-16 ENCOUNTER — Encounter: Payer: Self-pay | Admitting: *Deleted

## 2014-09-16 ENCOUNTER — Ambulatory Visit: Payer: Self-pay | Admitting: Urology

## 2014-09-16 ENCOUNTER — Encounter: Payer: Self-pay | Admitting: Urology

## 2015-12-02 IMAGING — CR DG CHEST 2V
1 series · 2 of 2 positions shown · non-contrast
Comparison: November 22, 2013

CLINICAL DATA: Difficulty breathing

EXAM:
CHEST  2 VIEW

[Series 1: dxr chest pa (or ap) and lateral · 0.14mm/px · 2 of 2 slices shown]
[im 1/2]
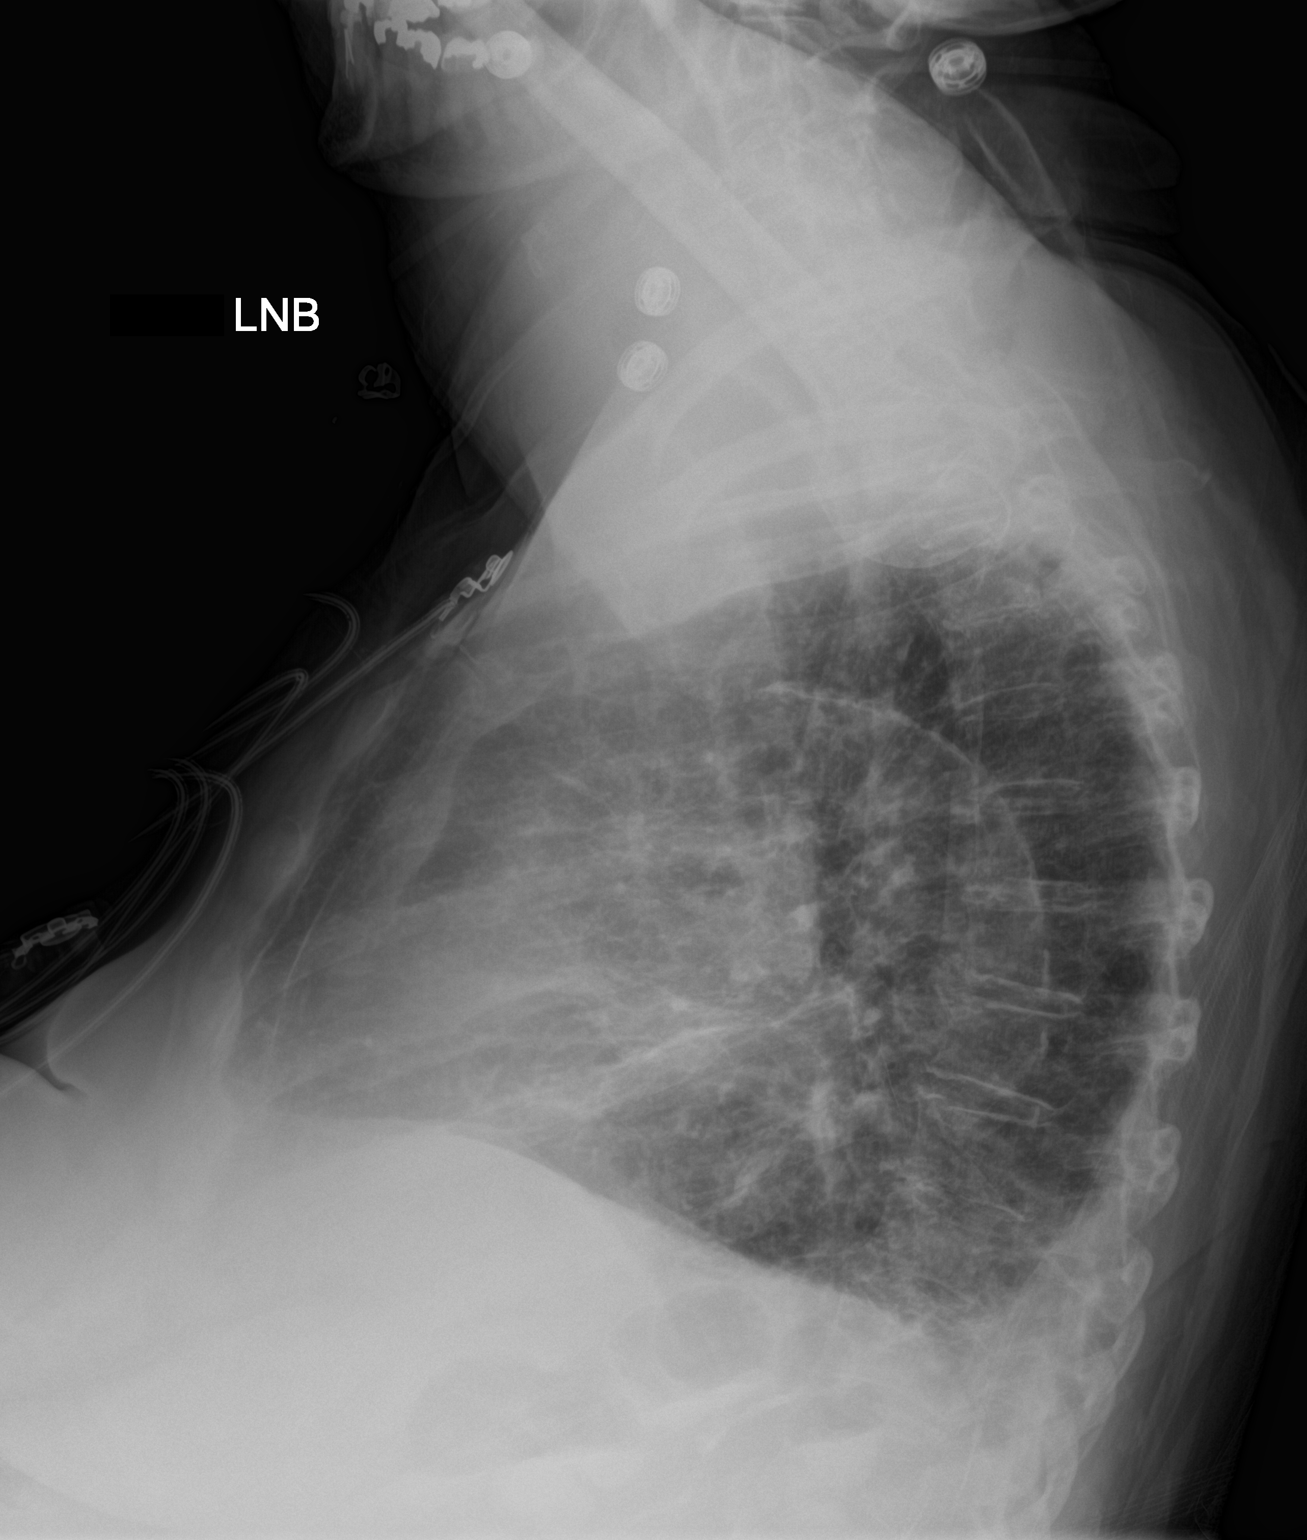
[im 2/2]
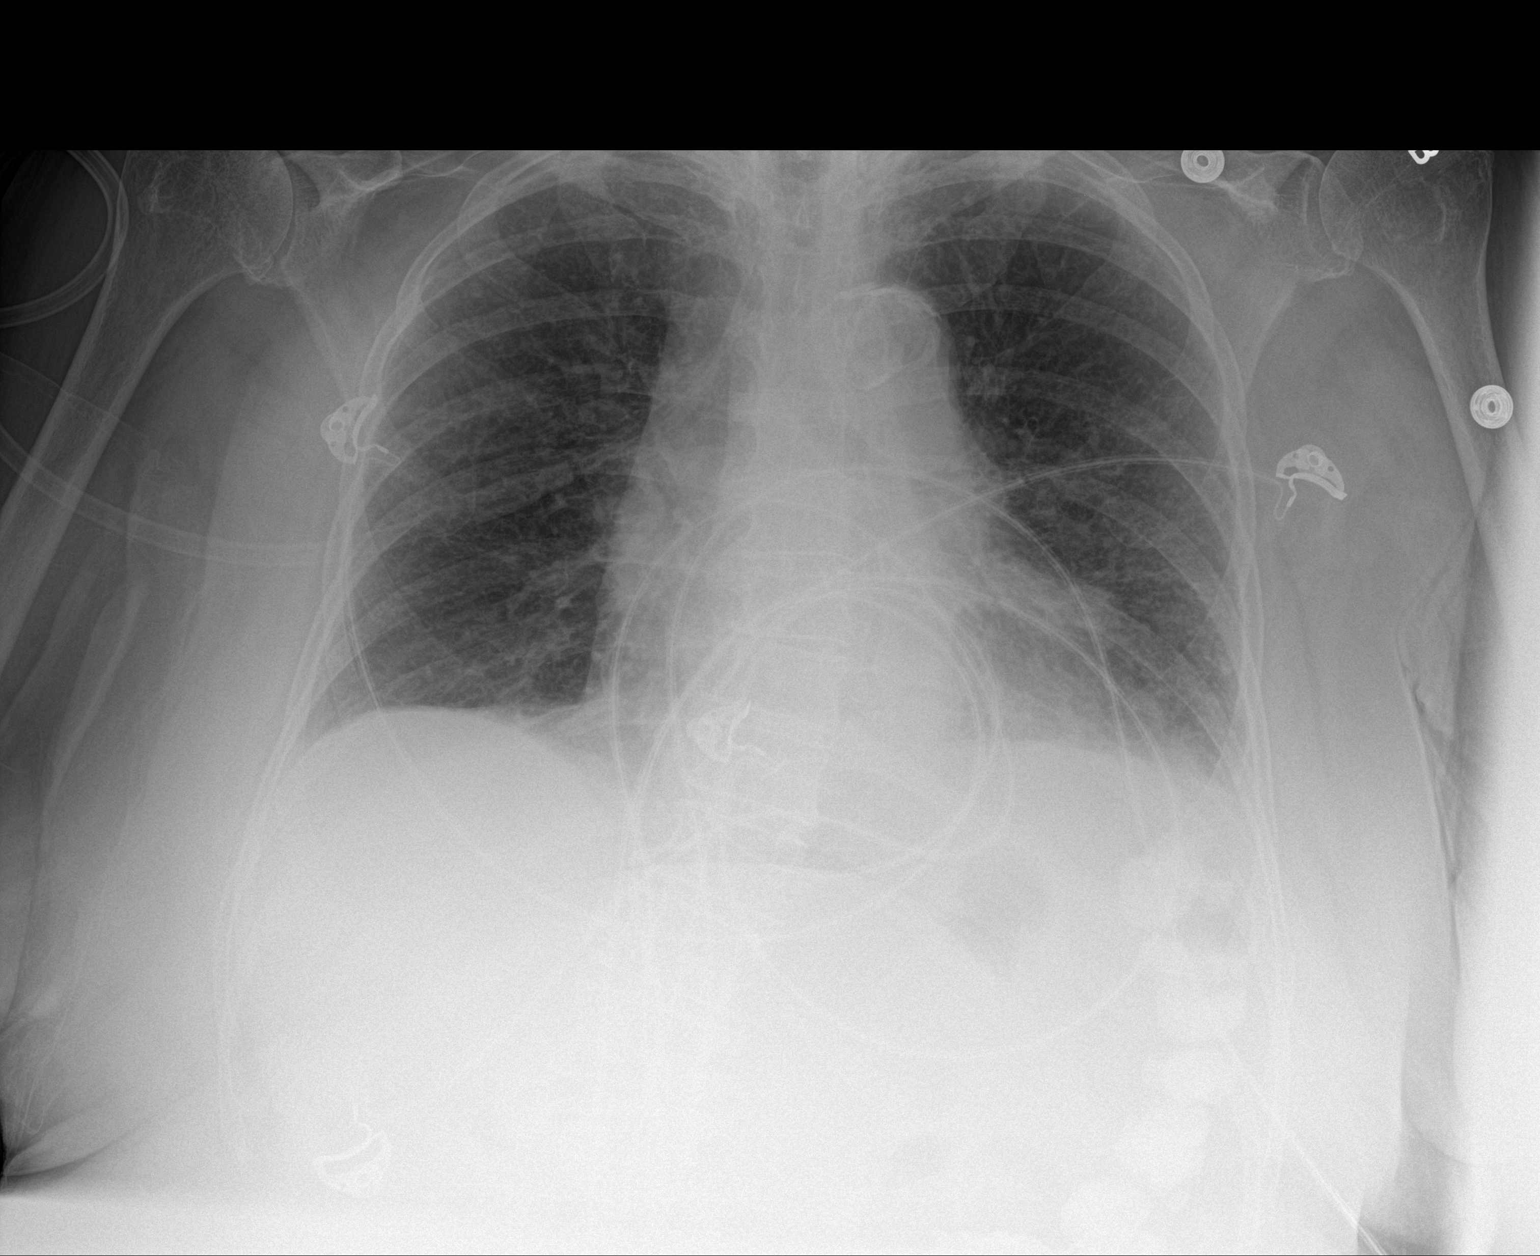

[2 of 2 positions shown; findings below may reference images not displayed]

FINDINGS: There is subtle infiltrate in the left base region. There is
underlying emphysematous change. Elsewhere lungs are clear. Heart is
mildly enlarged with pulmonary vascularity within normal limits.
There is atherosclerotic change throughout the aorta. No
pneumothorax. Bones are osteoporotic.
IMPRESSION: Underlying emphysema.  Subtle infiltrate left base.
